# Patient Record
Sex: Male | Born: 1935
Health system: Southern US, Community
[De-identification: ages and names within clinical notes are randomized; demographics above are authoritative.]

## PROBLEM LIST (undated history)

## (undated) DIAGNOSIS — E7800 Pure hypercholesterolemia, unspecified: Secondary | ICD-10-CM

## (undated) DIAGNOSIS — I10 Essential (primary) hypertension: Secondary | ICD-10-CM

## (undated) DIAGNOSIS — M1990 Unspecified osteoarthritis, unspecified site: Secondary | ICD-10-CM

## (undated) DIAGNOSIS — C801 Malignant (primary) neoplasm, unspecified: Secondary | ICD-10-CM

## (undated) HISTORY — PX: VASECTOMY: SHX75

## (undated) HISTORY — PX: APPENDECTOMY: SHX54

## (undated) HISTORY — DX: Pure hypercholesterolemia, unspecified: E78.00

## (undated) HISTORY — PX: BACK SURGERY: SHX140

## (undated) HISTORY — DX: Essential (primary) hypertension: I10

---

## 2002-08-18 ENCOUNTER — Encounter: Payer: Self-pay | Admitting: Internal Medicine

## 2002-08-18 ENCOUNTER — Ambulatory Visit (HOSPITAL_COMMUNITY): Admission: RE | Admit: 2002-08-18 | Discharge: 2002-08-18 | Payer: Self-pay | Admitting: Internal Medicine

## 2004-11-15 ENCOUNTER — Ambulatory Visit (HOSPITAL_COMMUNITY): Admission: RE | Admit: 2004-11-15 | Discharge: 2004-11-15 | Payer: Self-pay | Admitting: Internal Medicine

## 2004-12-06 ENCOUNTER — Ambulatory Visit (HOSPITAL_COMMUNITY): Admission: RE | Admit: 2004-12-06 | Discharge: 2004-12-06 | Payer: Self-pay | Admitting: Internal Medicine

## 2006-11-17 ENCOUNTER — Ambulatory Visit: Admission: RE | Admit: 2006-11-17 | Discharge: 2007-02-15 | Payer: Self-pay | Admitting: Radiation Oncology

## 2006-12-14 ENCOUNTER — Encounter: Admission: RE | Admit: 2006-12-14 | Discharge: 2006-12-14 | Payer: Self-pay | Admitting: Internal Medicine

## 2007-01-06 ENCOUNTER — Ambulatory Visit (HOSPITAL_BASED_OUTPATIENT_CLINIC_OR_DEPARTMENT_OTHER): Admission: RE | Admit: 2007-01-06 | Discharge: 2007-01-06 | Payer: Self-pay | Admitting: Urology

## 2008-10-05 ENCOUNTER — Encounter (INDEPENDENT_AMBULATORY_CARE_PROVIDER_SITE_OTHER): Payer: Self-pay | Admitting: *Deleted

## 2008-11-20 ENCOUNTER — Ambulatory Visit: Payer: Self-pay | Admitting: Gastroenterology

## 2008-12-04 ENCOUNTER — Ambulatory Visit: Payer: Self-pay | Admitting: Gastroenterology

## 2010-09-03 NOTE — Op Note (Signed)
John Huffman, John Huffman                ACCOUNT NO.:  [id number]   MEDICAL RECORD NO.:  [id number]          PATIENT TYPE:  AMB   LOCATION:  [facility]                         FACILITY:  [facility]   PHYSICIAN:  Ronald L. Earlene Plater, M.D.  DATE OF BIRTH:  1928/08/18   DATE OF PROCEDURE:  01/06/2007  DATE OF DISCHARGE:                               OPERATIVE REPORT   DIAGNOSIS:  Adenocarcinoma of prostate.   OPERATIVE PROCEDURE:  Robotic armed Nucletron seed implantation with  iodine 125 seeds and flexible cystourethroscopy.   SURGEON:  Gaynelle Arabian, M.D.   ASSISTANT:  Dr. Chipper Herb   ANESTHESIA:  LMA.   BLOOD LOSS:  Negligible.   TUBES:  16-French Foley.   COMPLICATIONS:  None.   A total of 59 iodine 125 seeds were implanted with 22 needles at 27.6120  mCi per total apparent activity.   PROCEDURE:  John Huffman is a very nice 77 year old white male who  presented with an elevated PSA of 5.85.  He subsequently went biopsy of  prostate which revealed a Gleason 6 adenocarcinoma of the left base and  the left mid prostate.  His prostate was 41 cc.  He considered all  options in detail and after understanding risks, benefits and  alternatives and being properly informed and properly simulated, elected  proceed with implantation of radioactive seed implantation.   PROCEDURE IN DETAIL:  The patient was placed in supine position.  After  proper LMA anesthesia, he was placed in the dorsal lithotomy position  and prepped and draped with Betadine in sterile fashion.  16-French  Foley catheter was inserted and the bladder was drained and the B and K  biplanar transrectal ultrasound probe was placed and the prostate  imaged.  The prostate was then measured and dosimetric planning was then  performed and we were comfortable with the dosimetry of the prostate and  the urethra and rectum.  Following this, two holding needles were placed  in unused coordinates.  The base was set with the first planned  needle  and serial implantation was then performed utilizing the Nucletron  implanter device.  A total of 59 iodine 125 seeds were planted with 22  needles, total apparent activity was 27.6120 mCi.  Following  implantation, static images were obtained.  We were very comfortable  with location of seeds all hardware was removed.  The patient was placed  in supine position.  The Foley catheter was removed, scanned for seeds  and there were none within it.  Flexible cystourethroscopy was then  performed.  He was noted to have mild trilobar hypertrophy.  Bladder was  smooth-walled.  Efflux of clear urine was noted from normally placed  ureteral orifices bilaterally and no seeds or spacers in the urethra or  other lesions in the  urethra or the bladder.  Flexible cystourethroscope was visually  removed.  A new 16-French Foley catheter was inserted and placed to  drainage.  The patient tolerated procedure well and was taken to the  recovery room stable.  Puncture wound site was dressed sterilely.      Ronald L.  Earlene Plater, M.D.  Electronically Signed     RLD/MEDQ  D:  01/06/2007  T:  01/07/2007  Job:  [id number]

## 2011-01-31 LAB — CBC
Hemoglobin: 14.9
MCHC: 34.6
MCV: 88.9
RBC: 4.86

## 2011-01-31 LAB — APTT: aPTT: 30

## 2011-01-31 LAB — COMPREHENSIVE METABOLIC PANEL
BUN: 18
CO2: 28
Calcium: 9.2
Creatinine, Ser: 0.94
GFR calc non Af Amer: >60
Glucose, Bld: 98

## 2011-01-31 LAB — PROTIME-INR: Prothrombin Time: 13.3

## 2011-07-09 DIAGNOSIS — C61 Malignant neoplasm of prostate: Secondary | ICD-10-CM | POA: Diagnosis not present

## 2011-08-19 DIAGNOSIS — Z85828 Personal history of other malignant neoplasm of skin: Secondary | ICD-10-CM | POA: Diagnosis not present

## 2011-08-19 DIAGNOSIS — L570 Actinic keratosis: Secondary | ICD-10-CM | POA: Diagnosis not present

## 2011-08-19 DIAGNOSIS — C44519 Basal cell carcinoma of skin of other part of trunk: Secondary | ICD-10-CM | POA: Diagnosis not present

## 2011-08-19 DIAGNOSIS — D0470 Carcinoma in situ of skin of unspecified lower limb, including hip: Secondary | ICD-10-CM | POA: Diagnosis not present

## 2011-08-19 DIAGNOSIS — C44721 Squamous cell carcinoma of skin of unspecified lower limb, including hip: Secondary | ICD-10-CM | POA: Diagnosis not present

## 2011-08-19 DIAGNOSIS — L821 Other seborrheic keratosis: Secondary | ICD-10-CM | POA: Diagnosis not present

## 2011-09-29 DIAGNOSIS — C61 Malignant neoplasm of prostate: Secondary | ICD-10-CM | POA: Diagnosis not present

## 2011-09-29 DIAGNOSIS — N320 Bladder-neck obstruction: Secondary | ICD-10-CM | POA: Diagnosis not present

## 2011-09-29 DIAGNOSIS — R972 Elevated prostate specific antigen [PSA]: Secondary | ICD-10-CM | POA: Diagnosis not present

## 2011-10-06 DIAGNOSIS — I10 Essential (primary) hypertension: Secondary | ICD-10-CM | POA: Diagnosis not present

## 2011-10-06 DIAGNOSIS — E785 Hyperlipidemia, unspecified: Secondary | ICD-10-CM | POA: Diagnosis not present

## 2011-10-06 DIAGNOSIS — Z79899 Other long term (current) drug therapy: Secondary | ICD-10-CM | POA: Diagnosis not present

## 2011-11-04 DIAGNOSIS — E785 Hyperlipidemia, unspecified: Secondary | ICD-10-CM | POA: Diagnosis not present

## 2011-11-04 DIAGNOSIS — I10 Essential (primary) hypertension: Secondary | ICD-10-CM | POA: Diagnosis not present

## 2011-11-04 DIAGNOSIS — C61 Malignant neoplasm of prostate: Secondary | ICD-10-CM | POA: Diagnosis not present

## 2011-11-12 ENCOUNTER — Ambulatory Visit (HOSPITAL_COMMUNITY)
Admission: RE | Admit: 2011-11-12 | Discharge: 2011-11-12 | Disposition: A | Discharge status: Home / Self Care | Payer: Medicare Other | Source: Ambulatory Visit | Attending: Internal Medicine | Admitting: Internal Medicine

## 2011-11-12 DIAGNOSIS — I359 Nonrheumatic aortic valve disorder, unspecified: Secondary | ICD-10-CM | POA: Insufficient documentation

## 2011-11-12 DIAGNOSIS — R011 Cardiac murmur, unspecified: Secondary | ICD-10-CM | POA: Diagnosis not present

## 2011-11-12 NOTE — Progress Notes (Signed)
*  PRELIMINARY RESULTS* Echocardiogram 2D Echocardiogram has been performed.  John Huffman 11/12/2011, 2:51 PM

## 2011-11-13 DIAGNOSIS — H6120 Impacted cerumen, unspecified ear: Secondary | ICD-10-CM | POA: Diagnosis not present

## 2011-12-04 ENCOUNTER — Ambulatory Visit (INDEPENDENT_AMBULATORY_CARE_PROVIDER_SITE_OTHER): Payer: Medicare Other | Admitting: Otolaryngology

## 2011-12-04 DIAGNOSIS — R1312 Dysphagia, oropharyngeal phase: Secondary | ICD-10-CM

## 2011-12-05 ENCOUNTER — Other Ambulatory Visit (INDEPENDENT_AMBULATORY_CARE_PROVIDER_SITE_OTHER): Payer: Self-pay | Admitting: Otolaryngology

## 2011-12-05 DIAGNOSIS — K117 Disturbances of salivary secretion: Secondary | ICD-10-CM

## 2011-12-10 ENCOUNTER — Ambulatory Visit (HOSPITAL_COMMUNITY)
Admission: RE | Admit: 2011-12-10 | Discharge: 2011-12-10 | Disposition: A | Discharge status: Home / Self Care | Payer: Medicare Other | Source: Ambulatory Visit | Attending: Otolaryngology | Admitting: Otolaryngology

## 2011-12-10 DIAGNOSIS — K1120 Sialoadenitis, unspecified: Secondary | ICD-10-CM | POA: Diagnosis not present

## 2011-12-10 DIAGNOSIS — K117 Disturbances of salivary secretion: Secondary | ICD-10-CM | POA: Diagnosis not present

## 2011-12-10 DIAGNOSIS — K449 Diaphragmatic hernia without obstruction or gangrene: Secondary | ICD-10-CM | POA: Diagnosis not present

## 2011-12-19 ENCOUNTER — Other Ambulatory Visit: Payer: Self-pay | Admitting: Otolaryngology

## 2011-12-19 ENCOUNTER — Other Ambulatory Visit: Payer: Self-pay | Admitting: Internal Medicine

## 2011-12-19 DIAGNOSIS — K228 Other specified diseases of esophagus: Secondary | ICD-10-CM

## 2011-12-29 DIAGNOSIS — R972 Elevated prostate specific antigen [PSA]: Secondary | ICD-10-CM | POA: Diagnosis not present

## 2012-01-08 ENCOUNTER — Encounter (INDEPENDENT_AMBULATORY_CARE_PROVIDER_SITE_OTHER): Payer: Self-pay | Admitting: *Deleted

## 2012-01-08 ENCOUNTER — Encounter (INDEPENDENT_AMBULATORY_CARE_PROVIDER_SITE_OTHER): Payer: Self-pay | Admitting: Internal Medicine

## 2012-01-08 ENCOUNTER — Ambulatory Visit (INDEPENDENT_AMBULATORY_CARE_PROVIDER_SITE_OTHER): Payer: Medicare Other | Admitting: Internal Medicine

## 2012-01-08 ENCOUNTER — Other Ambulatory Visit (INDEPENDENT_AMBULATORY_CARE_PROVIDER_SITE_OTHER): Payer: Self-pay | Admitting: *Deleted

## 2012-01-08 VITALS — BP 100/72 | HR 72 | Temp 98.2°F | Ht 71.0 in | Wt 187.7 lb

## 2012-01-08 DIAGNOSIS — K229 Disease of esophagus, unspecified: Secondary | ICD-10-CM

## 2012-01-08 DIAGNOSIS — K219 Gastro-esophageal reflux disease without esophagitis: Secondary | ICD-10-CM | POA: Diagnosis not present

## 2012-01-08 DIAGNOSIS — I10 Essential (primary) hypertension: Secondary | ICD-10-CM | POA: Insufficient documentation

## 2012-01-08 DIAGNOSIS — R9389 Abnormal findings on diagnostic imaging of other specified body structures: Secondary | ICD-10-CM | POA: Insufficient documentation

## 2012-01-08 DIAGNOSIS — E7800 Pure hypercholesterolemia, unspecified: Secondary | ICD-10-CM | POA: Insufficient documentation

## 2012-01-08 MED ORDER — OMEPRAZOLE 40 MG PO CPDR: 40.0000 mg | DELAYED_RELEASE_CAPSULE | Freq: Every day | ORAL | Status: DC

## 2012-01-08 NOTE — Progress Notes (Signed)
Subjective:     Patient ID: John Huffman, male   DOB: 03/19/1929, 78 y.o.   MRN: [id number]  HPIReferred to our office by Dr. Suszanne Conners for an abnormal DG Esophagus.  He tells me he is not having trouble swallowing. He says his saliva glands are overactive. He says he can "spit" every 30 seconds. He also tells me his eyes water all the time excessively. Symptoms for over a year. He says he can eat anything he wants. No problem eating steak.  Appetite is good. No weight loss. BM x 1 a day. Fiberoptic laryngoscopy in August of this by Dr Suszanne Conners: significant laryngeal edema and erythema. No suspicious mass or lesion is noted. Moderate amount of saliva is noted to be pooling within his mouth and pharynx.   12/05/2011 DG Esophagus: Occasional tertiary contractions.  Small hiatal hernia.  Mild focal distal esophageal narrowing. This does not cause  difficulty with passage of the barium tablet.   Review of Systems see hpi Current Outpatient Prescriptions  Medication Sig Dispense Refill  . amLODipine (NORVASC) 5 MG tablet Take 5 mg by mouth daily. Also taking Micardus 80mg       . aspirin 81 MG tablet Take 81 mg by mouth daily.      Marland Kitchen ibuprofen (ADVIL,MOTRIN) 100 MG chewable tablet Chew 100 mg by mouth every 8 (eight) hours as needed.      . simvastatin (ZOCOR) 20 MG tablet Take 20 mg by mouth every evening.      . Tamsulosin HCl (FLOMAX) 0.4 MG CAPS Take by mouth.      Marland Kitchen omeprazole (PRILOSEC) 40 MG capsule Take 1 capsule (40 mg total) by mouth daily.  90 capsule  3   Past Medical History  Diagnosis Date  . Hypertension   . High cholesterol    Past Surgical History  Procedure Date  . Back surgery   . Appendectomy   . Vasectomy    Family Status  Relation Status Death Age  . Mother Deceased     Pancreatic cancer  . Father Deceased     DM, complications of  (age 7)   History   Social History  . Marital Status: Married    Spouse Name: N/A    Number of Children: N/A  . Years of Education:  N/A   Occupational History  . Not on file.   Social History Main Topics  . Smoking status: Never Smoker   . Smokeless tobacco: Not on file  . Alcohol Use: No  . Drug Use: No  . Sexually Active: Not on file   Other Topics Concern  . Not on file   Social History Narrative  . No narrative on file   No Known Allergies       Objective:   Physical Exam Filed Vitals:   01/08/12 1508  BP: 100/72  Pulse: 72  Temp: 98.2 F (36.8 C)  Height: 5\' 11"  (1.803 m)  Weight: 187 lb 11.2 oz (85.14 kg)  Alert and oriented. Skin warm and dry. Oral mucosa is moist.   . Sclera anicteric, conjunctivae is pink. Thyroid not enlarged. No cervical lymphadenopathy. Lungs clear. Heart regular rate and rhythm.  Abdomen is soft. Bowel sounds are positive. No hepatomegaly. No abdominal masses felt. No tenderness.  No edema to lower extremities.  .       Assessment:    Excessive salivation. Abnormal Swallow test which showing distal esophageal  Narrowing. Esophageal stricture needs to be ruled out.    Plan:  EGD/ED. The risks and benefits such as perforation, bleeding, and infection were reviewed with the patient and is agreeable.    Omeprazole 40mg  po e-prescribed to [facility]

## 2012-01-08 NOTE — Patient Instructions (Addendum)
EGD/ED. The risks and benefits such as perforation, bleeding, and infection were reviewed with the patient and is agreeable.  Rx for Omeprazole e-prescribed to [facility]

## 2012-01-14 ENCOUNTER — Encounter (HOSPITAL_COMMUNITY): Payer: Self-pay | Admitting: Pharmacy Technician

## 2012-01-20 MED ORDER — SODIUM CHLORIDE 0.45 % IV SOLN
INTRAVENOUS | Status: DC
  Administered 2012-01-21: 10:00:00 via INTRAVENOUS

## 2012-01-21 ENCOUNTER — Ambulatory Visit (HOSPITAL_COMMUNITY)
Admission: RE | Admit: 2012-01-21 | Discharge: 2012-01-21 | Disposition: A | Discharge status: Home / Self Care | Payer: Medicare Other | Source: Ambulatory Visit | Attending: Internal Medicine | Admitting: Internal Medicine

## 2012-01-21 ENCOUNTER — Encounter (HOSPITAL_COMMUNITY)
Admission: RE | Disposition: A | Discharge status: Home / Self Care | Payer: Self-pay | Source: Ambulatory Visit | Attending: Internal Medicine

## 2012-01-21 ENCOUNTER — Encounter (HOSPITAL_COMMUNITY): Payer: Self-pay | Admitting: *Deleted

## 2012-01-21 DIAGNOSIS — K228 Other specified diseases of esophagus: Secondary | ICD-10-CM | POA: Insufficient documentation

## 2012-01-21 DIAGNOSIS — I10 Essential (primary) hypertension: Secondary | ICD-10-CM | POA: Diagnosis not present

## 2012-01-21 DIAGNOSIS — K229 Disease of esophagus, unspecified: Secondary | ICD-10-CM

## 2012-01-21 DIAGNOSIS — D1779 Benign lipomatous neoplasm of other sites: Secondary | ICD-10-CM | POA: Insufficient documentation

## 2012-01-21 DIAGNOSIS — K117 Disturbances of salivary secretion: Secondary | ICD-10-CM | POA: Diagnosis not present

## 2012-01-21 DIAGNOSIS — D1330 Benign neoplasm of unspecified part of small intestine: Secondary | ICD-10-CM | POA: Diagnosis not present

## 2012-01-21 DIAGNOSIS — K2289 Other specified disease of esophagus: Secondary | ICD-10-CM | POA: Insufficient documentation

## 2012-01-21 DIAGNOSIS — E7800 Pure hypercholesterolemia, unspecified: Secondary | ICD-10-CM | POA: Diagnosis not present

## 2012-01-21 DIAGNOSIS — K3182 Dieulafoy lesion (hemorrhagic) of stomach and duodenum: Secondary | ICD-10-CM

## 2012-01-21 HISTORY — DX: Malignant (primary) neoplasm, unspecified: C80.1

## 2012-01-21 SURGERY — ESOPHAGOGASTRODUODENOSCOPY (EGD) WITH ESOPHAGEAL DILATION
Anesthesia: Moderate Sedation

## 2012-01-21 MED ORDER — STERILE WATER FOR IRRIGATION IR SOLN
Status: DC | PRN
  Administered 2012-01-21: 10:00:00

## 2012-01-21 MED ORDER — MEPERIDINE HCL 50 MG/ML IJ SOLN
INTRAMUSCULAR | Status: AC
  Filled 2012-01-21: qty 1

## 2012-01-21 MED ORDER — MIDAZOLAM HCL 5 MG/5ML IJ SOLN
INTRAMUSCULAR | Status: DC | PRN
  Administered 2012-01-21: 1 mg via INTRAVENOUS
  Administered 2012-01-21 (×2): 2 mg via INTRAVENOUS

## 2012-01-21 MED ORDER — MEPERIDINE HCL 25 MG/ML IJ SOLN
INTRAMUSCULAR | Status: DC | PRN
  Administered 2012-01-21 (×2): 25 mg via INTRAVENOUS

## 2012-01-21 MED ORDER — MIDAZOLAM HCL 5 MG/5ML IJ SOLN
INTRAMUSCULAR | Status: AC
  Filled 2012-01-21: qty 10

## 2012-01-21 MED ORDER — PANTOPRAZOLE SODIUM 40 MG PO TBEC: 40.0000 mg | DELAYED_RELEASE_TABLET | Freq: Every day | ORAL | Status: DC

## 2012-01-21 NOTE — Op Note (Signed)
EGD PROCEDURE REPORT  PATIENT:  John Huffman  MR#:  [id number] Birthdate:  [birth year], 78 y.o., male Endoscopist:  Dr. Malissa Hippo, MD Referred By:  Dr. Darletta Moll, MD Procedure Date: 01/21/2012  Procedure:   EGD  Indications:  Patient is 78 year old Caucasian male who has been experiencing excessive salivation for one year. He has not responded PPI therapy. He was evaluated by Dr. to and no abnormality found to account for symptoms. He had barium study revealing small sliding heart hernia, tertiary contractions and noncritical distal esophagus. Denies dysphagia. He is undergoing diagnostic EGD.            Informed Consent:  The risks, benefits, alternatives & imponderables which include, but are not limited to, bleeding, infection, perforation, drug reaction and potential missed lesion have been reviewed.  The potential for biopsy, lesion removal, esophageal dilation, etc. have also been discussed.  Questions have been answered.  All parties agreeable.  Please see history & physical in medical record for more information.  Medications:  Demerol 50 mg IV Versed 5 mg IV Cetacaine spray topically for oropharyngeal anesthesia  Description of procedure:  The endoscope was introduced through the mouth and advanced to the second portion of the duodenum without difficulty or limitations. The mucosal surfaces were surveyed very carefully during advancement of the scope and upon withdrawal.  Findings:  Esophagus:  8-10 mm very soft submucosal lesion noted just below  UES with yellowish discoloration. It was soft on palpation with biopsy forceps. GEJ:  40 cm Stomach:  Stomach was empty and distended very well with insufflation. Folds in the proximal stomach were normal. Examination of mucosa at body, antrum, pyloric channel, angularis, fundus and cardia was normal. Duodenum:  Normal bulbar mucosa. Very prominent bifid ampulla of Vater possibly due to submucosal lipoma. It was very soft on  palpation. Biopsy was taken.  Therapeutic/Diagnostic Maneuvers Performed:  See above  Complications:  None  Impression: No evidence of erosive esophagitis or peptic ulcer disease. 8-10 mm submucosal lipoma just below the UES possibly of no significance. Prominent large ampulla of Vater possibly due to submucosal lipoma. Biopsy taken.  Recommendations:  Continue anti-reflux measures. Discontinue omeprazole. Pantoprazole 40 mg by mouth twice a day. I will contact patient with biopsy results and further recommendations.  REHMAN,NAJEEB U  01/21/2012  10:39 AM  CC: Dr. Carylon Perches, MD & Dr. Bonnetta Barry ref. provider found         Dr. Seward Meth, Jerl Santos, MD

## 2012-01-21 NOTE — H&P (Signed)
John Huffman is an 78 y.o. male.   Chief Complaint: Patient is here for esophagogastroduodenoscopy. HPI: Patient is 78 year old Caucasian male who presents with one-year history of excessive salivation and tear formation. He underwent evaluation by Dr. Suszanne Conners and no abnormality was found to account for symptoms. She also a.m. study was suggested she contracts is an noncritical narrowing GE junction. Ileum culture versus area without any delay. Patient denies heartburn or dysphagia to solids. Also complains of back taste in his mouth. He does not have any difficulty at night.  Past Medical History  Diagnosis Date  . Hypertension   . High cholesterol   . Cancer     Proatate    Past Surgical History  Procedure Date  . Back surgery   . Appendectomy   . Vasectomy     History reviewed. No pertinent family history. Social History:  reports that he has never smoked. He does not have any smokeless tobacco history on file. He reports that he does not drink alcohol or use illicit drugs.  Allergies: No Known Allergies  Medications Prior to Admission  Medication Sig Dispense Refill  . amLODipine (NORVASC) 5 MG tablet Take 5 mg by mouth daily. Also taking Micardus 80mg       . losartan (COZAAR) 100 MG tablet Take 100 mg by mouth daily.      Marland Kitchen omeprazole (PRILOSEC) 40 MG capsule Take 1 capsule (40 mg total) by mouth daily.  90 capsule  3  . simvastatin (ZOCOR) 20 MG tablet Take 20 mg by mouth every evening.      . Tamsulosin HCl (FLOMAX) 0.4 MG CAPS Take by mouth.      Marland Kitchen aspirin 81 MG tablet Take 81 mg by mouth daily.        No results found for this or any previous visit (from the past 48 hour(s)). No results found.  ROS  Blood pressure 139/78, pulse 80, temperature 97.8 F (36.6 C), temperature source Oral, resp. rate 18, height 5\' 11"  (1.803 m), weight 187 lb (84.823 kg), SpO2 94.00%. Physical Exam  Constitutional: He appears well-developed and well-nourished.  HENT:  Mouth/Throat:  Oropharynx is clear and moist.  Eyes: Conjunctivae normal are normal. No scleral icterus.  Neck: No thyromegaly present.  Cardiovascular: Normal rate and regular rhythm.   Murmur (grade 2/6 systolic ejection murmur at aortic area) heard. Respiratory: Effort normal and breath sounds normal.  GI: Soft. He exhibits no distension and no mass. There is no tenderness.  Musculoskeletal: He exhibits no edema.  Lymphadenopathy:    He has no cervical adenopathy.  Neurological: He is alert.  Skin: Skin is warm and dry.     Assessment/Plan Excessive salivation. ? GERD. Diagnostic esophagogastroduodenoscopy.  JohnNAJEEB Huffman 01/21/2012, 10:10 AM

## 2012-01-30 ENCOUNTER — Encounter (INDEPENDENT_AMBULATORY_CARE_PROVIDER_SITE_OTHER): Payer: Self-pay | Admitting: *Deleted

## 2012-02-17 ENCOUNTER — Other Ambulatory Visit: Payer: Self-pay | Admitting: Dermatology

## 2012-02-17 DIAGNOSIS — L821 Other seborrheic keratosis: Secondary | ICD-10-CM | POA: Diagnosis not present

## 2012-02-17 DIAGNOSIS — L570 Actinic keratosis: Secondary | ICD-10-CM | POA: Diagnosis not present

## 2012-02-17 DIAGNOSIS — C4401 Basal cell carcinoma of skin of lip: Secondary | ICD-10-CM | POA: Diagnosis not present

## 2012-02-17 DIAGNOSIS — D485 Neoplasm of uncertain behavior of skin: Secondary | ICD-10-CM | POA: Diagnosis not present

## 2012-02-17 DIAGNOSIS — C44319 Basal cell carcinoma of skin of other parts of face: Secondary | ICD-10-CM | POA: Diagnosis not present

## 2012-02-17 DIAGNOSIS — D2330 Other benign neoplasm of skin of unspecified part of face: Secondary | ICD-10-CM | POA: Diagnosis not present

## 2012-02-17 DIAGNOSIS — Z85828 Personal history of other malignant neoplasm of skin: Secondary | ICD-10-CM | POA: Diagnosis not present

## 2012-02-23 DIAGNOSIS — Z23 Encounter for immunization: Secondary | ICD-10-CM | POA: Diagnosis not present

## 2012-03-29 DIAGNOSIS — R972 Elevated prostate specific antigen [PSA]: Secondary | ICD-10-CM | POA: Diagnosis not present

## 2012-03-29 DIAGNOSIS — N320 Bladder-neck obstruction: Secondary | ICD-10-CM | POA: Diagnosis not present

## 2012-03-29 DIAGNOSIS — C61 Malignant neoplasm of prostate: Secondary | ICD-10-CM | POA: Diagnosis not present

## 2012-05-04 DIAGNOSIS — I10 Essential (primary) hypertension: Secondary | ICD-10-CM | POA: Diagnosis not present

## 2012-08-17 ENCOUNTER — Telehealth (INDEPENDENT_AMBULATORY_CARE_PROVIDER_SITE_OTHER): Payer: Self-pay | Admitting: Internal Medicine

## 2012-08-17 ENCOUNTER — Other Ambulatory Visit (INDEPENDENT_AMBULATORY_CARE_PROVIDER_SITE_OTHER): Payer: Self-pay | Admitting: Internal Medicine

## 2012-08-17 MED ORDER — LORAZEPAM 0.5 MG PO TABS: 0.5000 mg | ORAL_TABLET | Freq: Two times a day (BID) | ORAL | Status: DC

## 2012-08-17 NOTE — Telephone Encounter (Signed)
Patient's condition discussed with Dr. Suszanne Conners who saw patient last year. He feels patient's symptoms may do to swallowing issues rather than excessive salivation. Will try him on lorazepam 0.25 2.5 mg by mouth twice a day. Of into her recommendations with the patient over the phone and pharmacy called the prescription. He was informed of potential side effects. He will call us with progress report in one to 2 weeks.

## 2012-08-19 ENCOUNTER — Other Ambulatory Visit: Payer: Self-pay | Admitting: Dermatology

## 2012-08-19 DIAGNOSIS — C44319 Basal cell carcinoma of skin of other parts of face: Secondary | ICD-10-CM | POA: Diagnosis not present

## 2012-08-19 DIAGNOSIS — Z85828 Personal history of other malignant neoplasm of skin: Secondary | ICD-10-CM | POA: Diagnosis not present

## 2012-08-19 DIAGNOSIS — L821 Other seborrheic keratosis: Secondary | ICD-10-CM | POA: Diagnosis not present

## 2012-08-19 DIAGNOSIS — L570 Actinic keratosis: Secondary | ICD-10-CM | POA: Diagnosis not present

## 2012-08-19 DIAGNOSIS — D485 Neoplasm of uncertain behavior of skin: Secondary | ICD-10-CM | POA: Diagnosis not present

## 2012-08-25 ENCOUNTER — Encounter (INDEPENDENT_AMBULATORY_CARE_PROVIDER_SITE_OTHER): Payer: Self-pay

## 2012-09-27 ENCOUNTER — Ambulatory Visit (INDEPENDENT_AMBULATORY_CARE_PROVIDER_SITE_OTHER): Payer: Medicare Other | Admitting: Internal Medicine

## 2012-09-27 ENCOUNTER — Encounter (INDEPENDENT_AMBULATORY_CARE_PROVIDER_SITE_OTHER): Payer: Self-pay | Admitting: Internal Medicine

## 2012-09-27 VITALS — BP 110/68 | HR 72 | Temp 98.0°F | Resp 16 | Ht 71.0 in | Wt 183.2 lb

## 2012-09-27 DIAGNOSIS — R634 Abnormal weight loss: Secondary | ICD-10-CM | POA: Diagnosis not present

## 2012-09-27 DIAGNOSIS — K117 Disturbances of salivary secretion: Secondary | ICD-10-CM | POA: Diagnosis not present

## 2012-09-27 DIAGNOSIS — R109 Unspecified abdominal pain: Secondary | ICD-10-CM | POA: Insufficient documentation

## 2012-09-27 DIAGNOSIS — R51 Headache: Secondary | ICD-10-CM

## 2012-09-27 NOTE — Progress Notes (Signed)
Presenting complaint;  Excessive salivation. Patient also complains of abdominal pain and right-sided headache.  Subjective:  Patient is 79 year old Caucasian male who is here for scheduled visit. He's been dealing for excessive salivation for several months. ENT evaluation was negative. He underwent EGD in October 2013. He had small esophageal submucosal lesion possibly lipoma and a prominent ampulla of Vater biopsy which revealed benign tissue. Patient was treated with a PPI but without symptomatic improvement. About 5 weeks ago he was begun on breast exam which she's been taking twice daily. He cannot tell any difference. He is not having any side effects from this medication. He feels he makes too much saliva and has to clear his throat often. He saw learned to live with it. Today he is also complaining of pain in right temporal region for the last 3-4 months which he describes as pressure or mild pain. He has not experienced any auditory or visual symptoms. He has to stain very often and not daily. He also complains of intermittent right mid abdominal pain, loss of appetite. He states he has lost 8 pounds since December 2013. He denies nausea vomiting diarrhea constipation melena or rectal bleeding.  Current Medications: Current Outpatient Prescriptions  Medication Sig Dispense Refill  . amLODipine (NORVASC) 5 MG tablet Take 5 mg by mouth daily.      Marland Kitchen aspirin 81 MG tablet Take 81 mg by mouth daily.      Marland Kitchen LORazepam (ATIVAN) 0.5 MG tablet Take 1 tablet (0.5 mg total) by mouth 2 (two) times daily.  60 tablet  1  . losartan (COZAAR) 100 MG tablet Take 100 mg by mouth daily.      . pantoprazole (PROTONIX) 40 MG tablet Take 1 tablet (40 mg total) by mouth daily.  60 tablet  3  . simvastatin (ZOCOR) 20 MG tablet Take 20 mg by mouth every evening.      . Tamsulosin HCl (FLOMAX) 0.4 MG CAPS Take by mouth.       No current facility-administered medications for this visit.      Objective: Blood pressure 110/68, pulse 72, temperature 98 F (36.7 C), temperature source Oral, resp. rate 16, height 5\' 11"  (1.803 m), weight 183 lb 3.2 oz (83.099 kg). Patient is alert and in no acute distress. Both temporal arteries are palpable; no tenderness noted. Conjunctiva is pink. Sclera is nonicteric Oropharyngeal mucosa is normal. No neck masses or thyromegaly noted. Cardiac exam with regular rhythm normal S1 and S2. No murmur or gallop noted. Lungs are clear to auscultation. Abdomen is symmetrical. Bowel sounds are normal. No bruits noted. There is fullness involving the right. Molecular region with mild tenderness but no discrete mass palpable. No organomegaly or masses.  No LE edema or clubbing noted.   Assessment:  #1. Hypersalivation of unknown etiology. He is not a candidate for anti-cholinergic therapy. He did not respond to low-dose lorazepam or PPI. Will monitor him for now. #2. Right-sided abdominal pain associated with anorexia and 8 pound weight loss and fullness noted on exam. This symptom complex needs to be further evaluated. #3. Right temporal headache. No other symptoms suggest temporal arteritis.    Plan:  Decrease lorazepam dose to 0.5 mg daily for 10 days and then stop. Will check sedimentation rate. Abdominopelvic CT with contrast. He was also need serum creatinine. Further recommendations to follow.

## 2012-09-27 NOTE — Patient Instructions (Signed)
Decrease lorazepam dose to 0.5 mg once a day for 10 days and stop. Physician will contact you with results of blood work and CT abdomen and pelvis

## 2012-09-30 ENCOUNTER — Ambulatory Visit (HOSPITAL_COMMUNITY)
Admission: RE | Admit: 2012-09-30 | Discharge: 2012-09-30 | Disposition: A | Discharge status: Home / Self Care | Payer: Medicare Other | Source: Ambulatory Visit | Attending: Internal Medicine | Admitting: Internal Medicine

## 2012-09-30 DIAGNOSIS — Z8546 Personal history of malignant neoplasm of prostate: Secondary | ICD-10-CM | POA: Diagnosis not present

## 2012-09-30 DIAGNOSIS — R937 Abnormal findings on diagnostic imaging of other parts of musculoskeletal system: Secondary | ICD-10-CM | POA: Insufficient documentation

## 2012-09-30 DIAGNOSIS — I700 Atherosclerosis of aorta: Secondary | ICD-10-CM | POA: Diagnosis not present

## 2012-09-30 DIAGNOSIS — R634 Abnormal weight loss: Secondary | ICD-10-CM | POA: Diagnosis not present

## 2012-09-30 DIAGNOSIS — R109 Unspecified abdominal pain: Secondary | ICD-10-CM | POA: Diagnosis not present

## 2012-09-30 LAB — CREATININE, SERUM: Creat: 0.91 mg/dL (ref 0.50–1.35)

## 2012-09-30 LAB — SEDIMENTATION RATE: Sed Rate: 6 mm/hr (ref 0–16)

## 2012-09-30 MED ORDER — IOHEXOL 300 MG/ML  SOLN
100.0000 mL | Freq: Once | INTRAMUSCULAR | Status: AC | PRN
  Administered 2012-09-30: 100 mL via INTRAVENOUS

## 2012-10-01 ENCOUNTER — Ambulatory Visit (HOSPITAL_COMMUNITY): Payer: Medicare Other

## 2012-10-01 ENCOUNTER — Other Ambulatory Visit (HOSPITAL_COMMUNITY): Payer: Medicare Other

## 2012-10-04 DIAGNOSIS — C61 Malignant neoplasm of prostate: Secondary | ICD-10-CM | POA: Diagnosis not present

## 2012-10-04 DIAGNOSIS — N320 Bladder-neck obstruction: Secondary | ICD-10-CM | POA: Diagnosis not present

## 2012-10-04 DIAGNOSIS — R972 Elevated prostate specific antigen [PSA]: Secondary | ICD-10-CM | POA: Diagnosis not present

## 2012-10-15 ENCOUNTER — Other Ambulatory Visit (HOSPITAL_COMMUNITY): Payer: Self-pay | Admitting: Internal Medicine

## 2012-10-15 DIAGNOSIS — Z8546 Personal history of malignant neoplasm of prostate: Secondary | ICD-10-CM

## 2012-10-18 ENCOUNTER — Encounter (HOSPITAL_COMMUNITY)
Admission: RE | Admit: 2012-10-18 | Discharge: 2012-10-18 | Disposition: A | Discharge status: Home / Self Care | Payer: Medicare Other | Source: Ambulatory Visit | Attending: Internal Medicine | Admitting: Internal Medicine

## 2012-10-18 ENCOUNTER — Encounter (HOSPITAL_COMMUNITY): Payer: Self-pay

## 2012-10-18 DIAGNOSIS — Z8546 Personal history of malignant neoplasm of prostate: Secondary | ICD-10-CM | POA: Insufficient documentation

## 2012-10-18 DIAGNOSIS — C61 Malignant neoplasm of prostate: Secondary | ICD-10-CM | POA: Diagnosis not present

## 2012-10-18 MED ORDER — TECHNETIUM TC 99M MEDRONATE IV KIT
25.0000 | PACK | Freq: Once | INTRAVENOUS | Status: AC | PRN
  Administered 2012-10-18: 25 via INTRAVENOUS

## 2012-10-19 ENCOUNTER — Other Ambulatory Visit (HOSPITAL_COMMUNITY): Payer: Self-pay | Admitting: Internal Medicine

## 2012-10-19 DIAGNOSIS — C61 Malignant neoplasm of prostate: Secondary | ICD-10-CM

## 2012-10-19 DIAGNOSIS — R937 Abnormal findings on diagnostic imaging of other parts of musculoskeletal system: Secondary | ICD-10-CM

## 2012-10-19 DIAGNOSIS — C799 Secondary malignant neoplasm of unspecified site: Secondary | ICD-10-CM

## 2012-10-20 ENCOUNTER — Ambulatory Visit (HOSPITAL_COMMUNITY)
Admission: RE | Admit: 2012-10-20 | Discharge: 2012-10-20 | Disposition: A | Discharge status: Home / Self Care | Payer: Medicare Other | Source: Ambulatory Visit | Attending: Internal Medicine | Admitting: Internal Medicine

## 2012-10-20 DIAGNOSIS — C799 Secondary malignant neoplasm of unspecified site: Secondary | ICD-10-CM

## 2012-10-20 DIAGNOSIS — M6281 Muscle weakness (generalized): Secondary | ICD-10-CM | POA: Insufficient documentation

## 2012-10-20 DIAGNOSIS — G319 Degenerative disease of nervous system, unspecified: Secondary | ICD-10-CM | POA: Insufficient documentation

## 2012-10-20 DIAGNOSIS — R29818 Other symptoms and signs involving the nervous system: Secondary | ICD-10-CM | POA: Diagnosis not present

## 2012-10-20 DIAGNOSIS — Z8546 Personal history of malignant neoplasm of prostate: Secondary | ICD-10-CM | POA: Insufficient documentation

## 2012-11-09 DIAGNOSIS — Z79899 Other long term (current) drug therapy: Secondary | ICD-10-CM | POA: Diagnosis not present

## 2012-11-09 DIAGNOSIS — R7301 Impaired fasting glucose: Secondary | ICD-10-CM | POA: Diagnosis not present

## 2012-11-09 DIAGNOSIS — I10 Essential (primary) hypertension: Secondary | ICD-10-CM | POA: Diagnosis not present

## 2012-11-09 DIAGNOSIS — E785 Hyperlipidemia, unspecified: Secondary | ICD-10-CM | POA: Diagnosis not present

## 2012-11-09 DIAGNOSIS — C61 Malignant neoplasm of prostate: Secondary | ICD-10-CM | POA: Diagnosis not present

## 2012-11-16 DIAGNOSIS — E785 Hyperlipidemia, unspecified: Secondary | ICD-10-CM | POA: Diagnosis not present

## 2012-11-16 DIAGNOSIS — C61 Malignant neoplasm of prostate: Secondary | ICD-10-CM | POA: Diagnosis not present

## 2012-11-16 DIAGNOSIS — I10 Essential (primary) hypertension: Secondary | ICD-10-CM | POA: Diagnosis not present

## 2012-12-28 ENCOUNTER — Encounter (INDEPENDENT_AMBULATORY_CARE_PROVIDER_SITE_OTHER): Payer: Self-pay | Admitting: Internal Medicine

## 2012-12-28 ENCOUNTER — Ambulatory Visit (INDEPENDENT_AMBULATORY_CARE_PROVIDER_SITE_OTHER): Payer: Medicare Other | Admitting: Internal Medicine

## 2012-12-28 VITALS — BP 116/74 | HR 70 | Temp 97.7°F | Resp 16 | Ht 71.0 in | Wt 182.9 lb

## 2012-12-28 DIAGNOSIS — K117 Disturbances of salivary secretion: Secondary | ICD-10-CM

## 2012-12-28 NOTE — Patient Instructions (Signed)
Take pantoprazole 40 mg by mouth 30 minutes before breakfast daily for one month and then every other day for one month and stop. If you start experiencing heartburn regurgitation you can back on pantoprazole and let us know.

## 2012-12-28 NOTE — Progress Notes (Signed)
Presenting complaint;  Excessive salivation.  Database/Subjective:  Patient is a 79-year-old Caucasian male who has several month history of excessive salivation who presents for scheduled visit. He was last seen 3 months ago. On his last visit he was also complaining of right-sided abdominal pain and underwent abdominopelvic CT which revealed no visceral abnormality. 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 He subsequent had bone scan by Dr. Ouida Sills which did not show any abnormality to this region but it showed abnormality to the left posterior skull Subsequent unenhanced head CT was negative for lytic or sclerotic lesions involving the skull. He feels better. He states his excessive salivation has not changed but he has learned to call with that. He seemed to do well when he has been called for doing other chores. His appetite has not like it used to be but he has not lost any more weight. Prior to his last visit of June 2014 he had lost 8 pounds. He has not experienced abdominal pain anymore. He denies heartburn hoarseness sore throat chronic cough nausea or vomiting. He also denies melena or rectal bleeding. He takes stool softener when necessary for constipation which is not listed below. He would like to come off pantoprazole as he does not see any benefit by staying on this medication. He was begun on double dose PPI by Dr. Suszanne Conners for possible LPR. He underwent EGD in October 2013 revealing small submucosal lipoma at the UES and abnormal appearance to the ampulla of Vater. Biopsy revealed benign tissue.     Current Medications: Current Outpatient Prescriptions  Medication Sig Dispense Refill  . amLODipine (NORVASC) 5 MG tablet Take 5 mg by mouth daily.      Marland Kitchen aspirin 81 MG tablet Take 81 mg by mouth daily.      Marland Kitchen losartan (COZAAR) 100 MG tablet Take 100 mg by mouth daily.      . pantoprazole (PROTONIX) 40 MG tablet Take 1 tablet (40 mg total) by mouth daily.  60  tablet  3  . simvastatin (ZOCOR) 20 MG tablet Take 20 mg by mouth every evening.      . Tamsulosin HCl (FLOMAX) 0.4 MG CAPS Take by mouth daily.        No current facility-administered medications for this visit.     Objective: Blood pressure 116/74, pulse 70, temperature 97.7 F (36.5 C), temperature source Oral, resp. rate 16, height 5\' 11"  (1.803 m), weight 182 lb 14.4 oz (82.963 kg). Patient is alert and in no acute distress. Conjunctiva is pink. Sclera is nonicteric Oropharyngeal mucosa is normal. No neck masses or thyromegaly noted. Cardiac exam with regular rhythm normal S1 and S2. No murmur or gallop noted. Lungs are clear to auscultation. Abdomen is soft and nontender without organomegaly or masses.  No LE edema or clubbing noted.   Assessment:  #1. Excessive salivation of unclear etiology without response to PPI and lorazepam. He is not a candidate for anti-cholinergic therapy. #2. Weight loss. He has not lost any weight since his last visit 3 months ago. #3. PPI therapy. He was begun on PPI therapy for possible LPR by Dr.Teoh. He does not have typical or atypical symptoms of GERD. It would therefore be reasonable to taper them off PPI. #4.   Plan:  Pantoprazole 40 mg by mouth every morning for one month followed by pantoprazole 40 mg every other day for one month and stop. Call if symptoms relapse. Office visit in 6 months.

## 2013-01-03 DIAGNOSIS — R972 Elevated prostate specific antigen [PSA]: Secondary | ICD-10-CM | POA: Diagnosis not present

## 2013-02-17 DIAGNOSIS — Z23 Encounter for immunization: Secondary | ICD-10-CM | POA: Diagnosis not present

## 2013-02-24 ENCOUNTER — Other Ambulatory Visit: Payer: Self-pay | Admitting: Dermatology

## 2013-02-24 DIAGNOSIS — L723 Sebaceous cyst: Secondary | ICD-10-CM | POA: Diagnosis not present

## 2013-02-24 DIAGNOSIS — C4441 Basal cell carcinoma of skin of scalp and neck: Secondary | ICD-10-CM | POA: Diagnosis not present

## 2013-02-24 DIAGNOSIS — Z85828 Personal history of other malignant neoplasm of skin: Secondary | ICD-10-CM | POA: Diagnosis not present

## 2013-02-24 DIAGNOSIS — D239 Other benign neoplasm of skin, unspecified: Secondary | ICD-10-CM | POA: Diagnosis not present

## 2013-02-24 DIAGNOSIS — L570 Actinic keratosis: Secondary | ICD-10-CM | POA: Diagnosis not present

## 2013-02-24 DIAGNOSIS — D485 Neoplasm of uncertain behavior of skin: Secondary | ICD-10-CM | POA: Diagnosis not present

## 2013-04-04 DIAGNOSIS — N320 Bladder-neck obstruction: Secondary | ICD-10-CM | POA: Diagnosis not present

## 2013-04-04 DIAGNOSIS — Z809 Family history of malignant neoplasm, unspecified: Secondary | ICD-10-CM | POA: Diagnosis not present

## 2013-04-04 DIAGNOSIS — E7800 Pure hypercholesterolemia, unspecified: Secondary | ICD-10-CM | POA: Diagnosis not present

## 2013-04-04 DIAGNOSIS — I10 Essential (primary) hypertension: Secondary | ICD-10-CM | POA: Diagnosis not present

## 2013-04-04 DIAGNOSIS — C61 Malignant neoplasm of prostate: Secondary | ICD-10-CM | POA: Diagnosis not present

## 2013-04-04 DIAGNOSIS — K4090 Unilateral inguinal hernia, without obstruction or gangrene, not specified as recurrent: Secondary | ICD-10-CM | POA: Diagnosis not present

## 2013-04-04 DIAGNOSIS — K4191 Unilateral femoral hernia, without obstruction or gangrene, recurrent: Secondary | ICD-10-CM | POA: Diagnosis not present

## 2013-04-04 DIAGNOSIS — R972 Elevated prostate specific antigen [PSA]: Secondary | ICD-10-CM | POA: Diagnosis not present

## 2013-05-20 DIAGNOSIS — R413 Other amnesia: Secondary | ICD-10-CM | POA: Diagnosis not present

## 2013-05-20 DIAGNOSIS — I10 Essential (primary) hypertension: Secondary | ICD-10-CM | POA: Diagnosis not present

## 2013-06-14 ENCOUNTER — Other Ambulatory Visit (HOSPITAL_COMMUNITY): Payer: Self-pay | Admitting: Internal Medicine

## 2013-06-14 DIAGNOSIS — R413 Other amnesia: Secondary | ICD-10-CM

## 2013-06-15 DIAGNOSIS — R413 Other amnesia: Secondary | ICD-10-CM | POA: Diagnosis not present

## 2013-06-20 ENCOUNTER — Ambulatory Visit (HOSPITAL_COMMUNITY)
Admission: RE | Admit: 2013-06-20 | Discharge: 2013-06-20 | Disposition: A | Discharge status: Home / Self Care | Payer: Medicare Other | Source: Ambulatory Visit | Attending: Internal Medicine | Admitting: Internal Medicine

## 2013-06-20 DIAGNOSIS — R413 Other amnesia: Secondary | ICD-10-CM

## 2013-06-20 DIAGNOSIS — G319 Degenerative disease of nervous system, unspecified: Secondary | ICD-10-CM | POA: Insufficient documentation

## 2013-06-20 DIAGNOSIS — R040 Epistaxis: Secondary | ICD-10-CM | POA: Diagnosis not present

## 2013-06-20 DIAGNOSIS — R51 Headache: Secondary | ICD-10-CM | POA: Insufficient documentation

## 2013-06-24 DIAGNOSIS — R413 Other amnesia: Secondary | ICD-10-CM | POA: Diagnosis not present

## 2013-06-28 ENCOUNTER — Ambulatory Visit (INDEPENDENT_AMBULATORY_CARE_PROVIDER_SITE_OTHER): Payer: Medicare Other | Admitting: Internal Medicine

## 2013-07-11 ENCOUNTER — Ambulatory Visit (INDEPENDENT_AMBULATORY_CARE_PROVIDER_SITE_OTHER): Payer: Medicare Other | Admitting: Internal Medicine

## 2013-08-04 ENCOUNTER — Ambulatory Visit (INDEPENDENT_AMBULATORY_CARE_PROVIDER_SITE_OTHER): Payer: Medicare Other

## 2013-08-04 ENCOUNTER — Ambulatory Visit (INDEPENDENT_AMBULATORY_CARE_PROVIDER_SITE_OTHER): Payer: Medicare Other | Admitting: Orthopedic Surgery

## 2013-08-04 VITALS — BP 117/71 | Ht 71.0 in | Wt 180.0 lb

## 2013-08-04 DIAGNOSIS — M79609 Pain in unspecified limb: Secondary | ICD-10-CM

## 2013-08-04 DIAGNOSIS — M6530 Trigger finger, unspecified finger: Secondary | ICD-10-CM | POA: Insufficient documentation

## 2013-08-04 DIAGNOSIS — M79643 Pain in unspecified hand: Secondary | ICD-10-CM

## 2013-08-04 DIAGNOSIS — M19049 Primary osteoarthritis, unspecified hand: Secondary | ICD-10-CM | POA: Insufficient documentation

## 2013-08-04 NOTE — Patient Instructions (Addendum)
You have received a steroid shot. 15% of patients experience increased pain at the injection site with in the next 24 hours. This is best treated with ice and tylenol extra strength 2 tabs every 8 hours. If you are still having pain please call the office.    aspercreme  Or capzacin for finger

## 2013-08-04 NOTE — Progress Notes (Signed)
Patient ID: John Huffman, male   DOB: 1929/01/25, 80 y.o.   MRN: [id number] Chief Complaint  Patient presents with  . Hand Pain    Right hand pain, no injury.    80 year old male who presents with pain over the right index finger the minute carpal phalangeal joint and inability to flex his index finger same digit. Symptoms started several months ago treated with topical rubs and creams over-the-counter did not improve now presents with continued pain at the same joint. During the wintertime he had acute swelling of the joint it has since subsided  His only complaints on review of systems is joint pain  Medical history reviewed. No major medical problems to speak of he take some amlodipine and losartan for blood pressure he is married doesn't smoke or drink  BP 117/71  Ht 5\' 11"  (1.803 m)  Wt 180 lb (81.647 kg)  BMI 25.12 kg/m2 General appearance is normal, the patient is alert and oriented x3 with normal mood and affect. Evaluation of that index finger right hand shows tenderness crepitus and decreased range of motion of the metacarpophalangeal joint with inability to flex the index finger and tenderness over the A1 pulley, flexor tendon power however is intact FDP and FDS skin is normal good capillary refill color and sensation  No lymph nodes are palpable in the right upper extremity  X-rays show arthritis intercarpal joint and index metacarpal phalangeal joint   Arthritis right hand Trigger finger right hand   Recommend injection to hopefully improve triggering  Recommend topical capsaicin or Aspercreme  Return in 2 weeks to reexamine

## 2013-08-08 DIAGNOSIS — L989 Disorder of the skin and subcutaneous tissue, unspecified: Secondary | ICD-10-CM | POA: Diagnosis not present

## 2013-08-08 DIAGNOSIS — B079 Viral wart, unspecified: Secondary | ICD-10-CM | POA: Diagnosis not present

## 2013-08-18 ENCOUNTER — Ambulatory Visit (INDEPENDENT_AMBULATORY_CARE_PROVIDER_SITE_OTHER): Payer: Medicare Other | Admitting: Orthopedic Surgery

## 2013-08-18 ENCOUNTER — Encounter: Payer: Self-pay | Admitting: Orthopedic Surgery

## 2013-08-18 VITALS — BP 128/70 | Ht 71.0 in | Wt 180.0 lb

## 2013-08-18 DIAGNOSIS — M6530 Trigger finger, unspecified finger: Secondary | ICD-10-CM

## 2013-08-18 DIAGNOSIS — M79609 Pain in unspecified limb: Secondary | ICD-10-CM

## 2013-08-18 DIAGNOSIS — M19049 Primary osteoarthritis, unspecified hand: Secondary | ICD-10-CM | POA: Diagnosis not present

## 2013-08-18 DIAGNOSIS — M79643 Pain in unspecified hand: Secondary | ICD-10-CM

## 2013-08-18 NOTE — Progress Notes (Signed)
Patient ID: John Huffman, male   DOB: [birth year], 80 y.o.   MRN: [id number]  Chief Complaint  Patient presents with  . Follow-up    2 week recheck on right hand index finger, trigger. S/p injection.    BP 128/70  Ht 5\' 11"  (1.803 m)  Wt 180 lb (81.647 kg)  BMI 25.12 kg/m2  Encounter Diagnoses  Name Primary?  . Hand pain Yes  . Arthritis of hand   . Trigger finger of right hand     Patient reports that his finger is better although his motion at the metacarpophalangeal joints without tenderness or restriction of motion in that area  Recommend

## 2013-08-19 DIAGNOSIS — R413 Other amnesia: Secondary | ICD-10-CM | POA: Diagnosis not present

## 2013-08-25 ENCOUNTER — Other Ambulatory Visit: Payer: Self-pay | Admitting: Dermatology

## 2013-08-25 DIAGNOSIS — D239 Other benign neoplasm of skin, unspecified: Secondary | ICD-10-CM | POA: Diagnosis not present

## 2013-08-25 DIAGNOSIS — L820 Inflamed seborrheic keratosis: Secondary | ICD-10-CM | POA: Diagnosis not present

## 2013-08-25 DIAGNOSIS — L723 Sebaceous cyst: Secondary | ICD-10-CM | POA: Diagnosis not present

## 2013-08-25 DIAGNOSIS — Z85828 Personal history of other malignant neoplasm of skin: Secondary | ICD-10-CM | POA: Diagnosis not present

## 2013-08-25 DIAGNOSIS — L821 Other seborrheic keratosis: Secondary | ICD-10-CM | POA: Diagnosis not present

## 2013-08-25 DIAGNOSIS — C44319 Basal cell carcinoma of skin of other parts of face: Secondary | ICD-10-CM | POA: Diagnosis not present

## 2013-08-25 DIAGNOSIS — L570 Actinic keratosis: Secondary | ICD-10-CM | POA: Diagnosis not present

## 2013-08-25 DIAGNOSIS — D485 Neoplasm of uncertain behavior of skin: Secondary | ICD-10-CM | POA: Diagnosis not present

## 2013-09-19 ENCOUNTER — Ambulatory Visit (INDEPENDENT_AMBULATORY_CARE_PROVIDER_SITE_OTHER): Payer: Medicare Other | Admitting: Internal Medicine

## 2013-09-19 ENCOUNTER — Encounter (INDEPENDENT_AMBULATORY_CARE_PROVIDER_SITE_OTHER): Payer: Self-pay | Admitting: Internal Medicine

## 2013-09-19 VITALS — BP 118/72 | HR 76 | Temp 98.1°F | Resp 16 | Ht 71.0 in | Wt 179.0 lb

## 2013-09-19 DIAGNOSIS — K219 Gastro-esophageal reflux disease without esophagitis: Secondary | ICD-10-CM

## 2013-09-19 DIAGNOSIS — K117 Disturbances of salivary secretion: Secondary | ICD-10-CM | POA: Diagnosis not present

## 2013-09-19 NOTE — Patient Instructions (Signed)
Call if symptoms change.

## 2013-09-19 NOTE — Progress Notes (Signed)
Presenting complaint;  Followup for excessive salivation. History of GERD.  Subjective:  Patient is a 81 year old Caucasian male who presents for scheduled visit. He was last seen in September 2014. He remains with excessive salivation. However he feels he is able to cope with this symptom better than he was able to in the past. He he has no difficulty or need to spit saliva when he's playing golf or at church service. Similarly if he sleeps with his mouth closed he has no difficulty. He states when he is taking shower he seemed to produce lot more saliva. He does not experience heartburn while on PPI. He has had intermittent hoarseness like today. He denies dysphagia nausea vomiting nocturnal regurgitation or anorexia. He has lost 3 pounds since his last visit. He is having memory issues and he was begun on Donezepil by Dr. Willey Blade one month ago but so far he cannot tell any difference. He's hoping that medication will prevent further memory loss. He is not having any side effects with pantoprazole.  Current Medications: Outpatient Encounter Prescriptions as of 09/19/2013  Medication Sig  . amLODipine (NORVASC) 5 MG tablet Take 5 mg by mouth daily.  Marland Kitchen aspirin 81 MG tablet Take 81 mg by mouth daily.  Marland Kitchen donepezil (ARICEPT) 5 MG tablet Take 5 mg by mouth daily.  Marland Kitchen losartan (COZAAR) 100 MG tablet Take 100 mg by mouth daily.  . pantoprazole (PROTONIX) 40 MG tablet Take 1 tablet (40 mg total) by mouth daily.  . simvastatin (ZOCOR) 20 MG tablet Take 20 mg by mouth every evening.  . Tamsulosin HCl (FLOMAX) 0.4 MG CAPS Take by mouth daily.      Objective: Blood pressure 118/72, pulse 76, temperature 98.1 F (36.7 C), temperature source Oral, resp. rate 16, height 5\' 11"  (1.803 m), weight 179 lb (81.194 kg). Patient is alert and in no acute distress. Conjunctiva is pink. Sclera is nonicteric Oropharyngeal mucosa is normal. No neck masses or thyromegaly noted. Cardiac exam with regular rhythm  normal S1 and S2. No murmur or gallop noted. Lungs are clear to auscultation. Abdomen symmetrical soft and nontender without organomegaly or masses. No LE edema or clubbing noted.    Assessment:  #1. Hypersalivation without clear-cut etiology. Workup has been negative including evaluation by Dr. Benjamine Mola and he also had negative unenhanced head CT 3 months ago. He's not a candidate for anti-cholinergic therapy which I am afraid will ask problems. #2. GERD. Symptoms are well controlled with therapy and I do not believe hypersalivation is secondary to GERD.    Plan:  Continue pantoprazole at current dose. Call if symptoms change otherwise followup visit in 6 months.

## 2013-10-03 DIAGNOSIS — N320 Bladder-neck obstruction: Secondary | ICD-10-CM | POA: Diagnosis not present

## 2013-10-03 DIAGNOSIS — C61 Malignant neoplasm of prostate: Secondary | ICD-10-CM | POA: Diagnosis not present

## 2013-10-03 DIAGNOSIS — K4090 Unilateral inguinal hernia, without obstruction or gangrene, not specified as recurrent: Secondary | ICD-10-CM | POA: Diagnosis not present

## 2013-10-03 DIAGNOSIS — R972 Elevated prostate specific antigen [PSA]: Secondary | ICD-10-CM | POA: Diagnosis not present

## 2013-10-24 IMAGING — CT CT ABD-PELV W/ CM
2 of 5 series · 16 of 46 positions shown, 18 images · IV contrast (Omnipaque 300)
Comparison: None.

CLINICAL DATA: Right abdominal pain and weight loss.  History of
prostate cancer.

CT ABDOMEN AND PELVIS WITH CONTRAST
TECHNIQUE: Multidetector CT imaging of the abdomen and pelvis was
performed following the standard protocol during bolus
administration of intravenous contrast.
Contrast: 100mL OMNIPAQUE IOHEXOL 300 MG/ML  SOLN

[Series 2: abd_pel_with 5.0 b40f · axial · 0.80mm/px · z∈[-446,+4]mm · 13 of 102 slices shown, 15 images]
[im 6/102  soft-tissue]
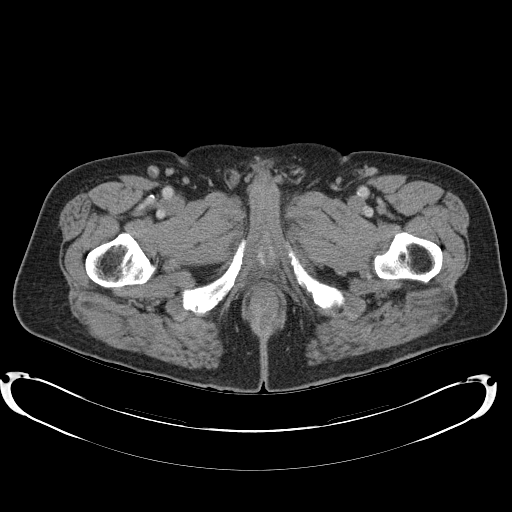
[im 6/102  bone]
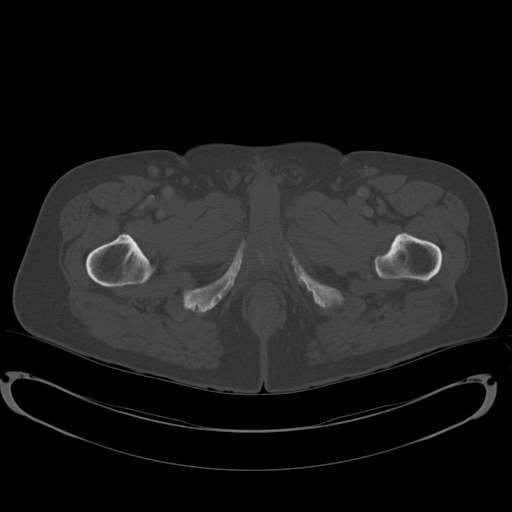
[im 12/102  soft-tissue]
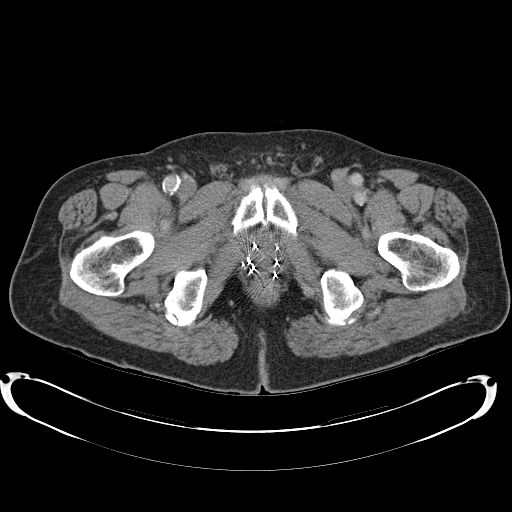
[im 24/102  soft-tissue]
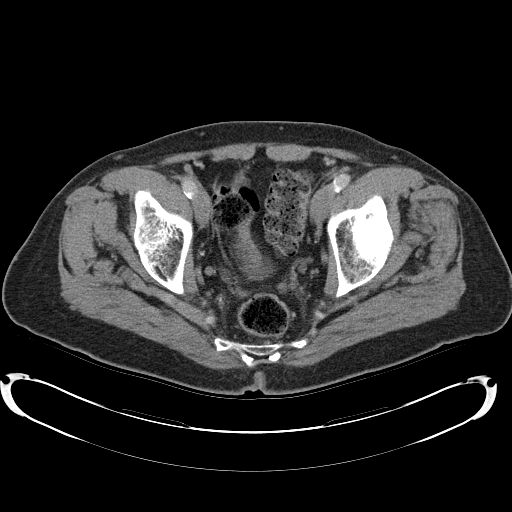
[im 30/102  soft-tissue]
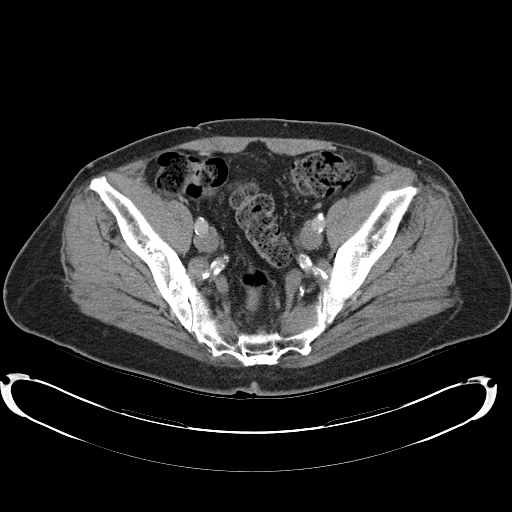
[im 36/102  soft-tissue]
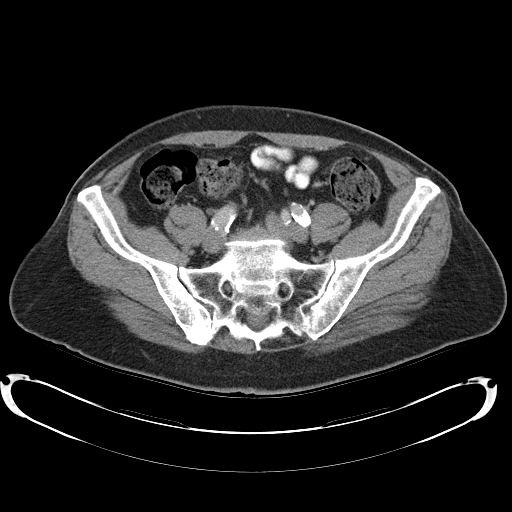
[im 42/102  soft-tissue]
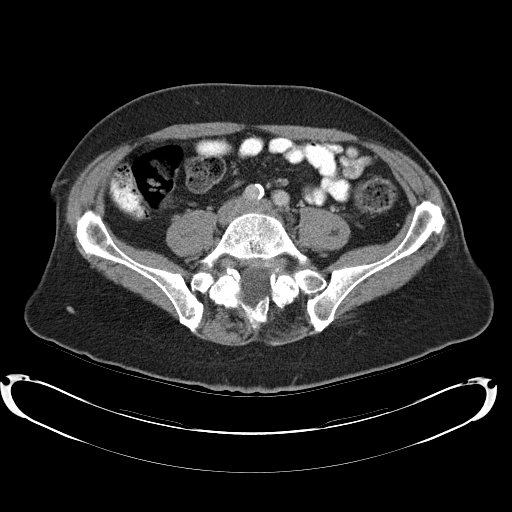
[im 54/102  soft-tissue]
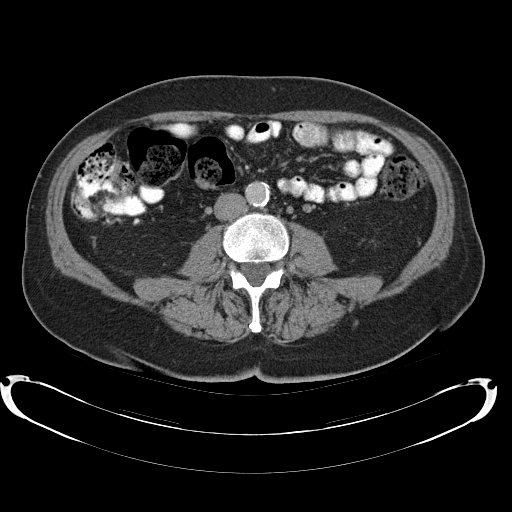
[im 60/102  soft-tissue]
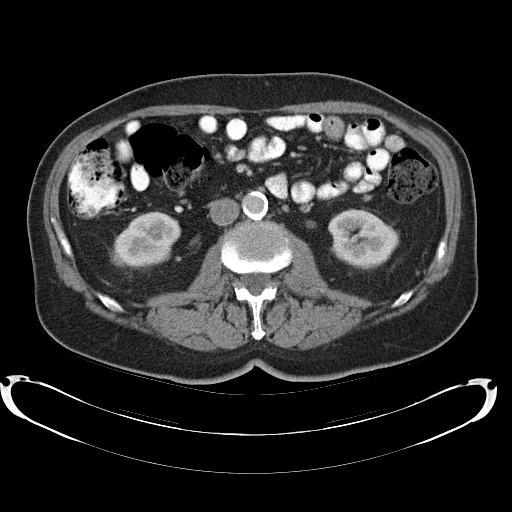
[im 66/102  soft-tissue]
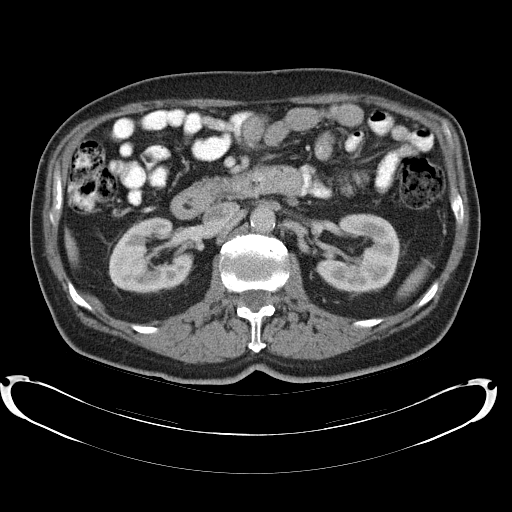
[im 66/102  bone]
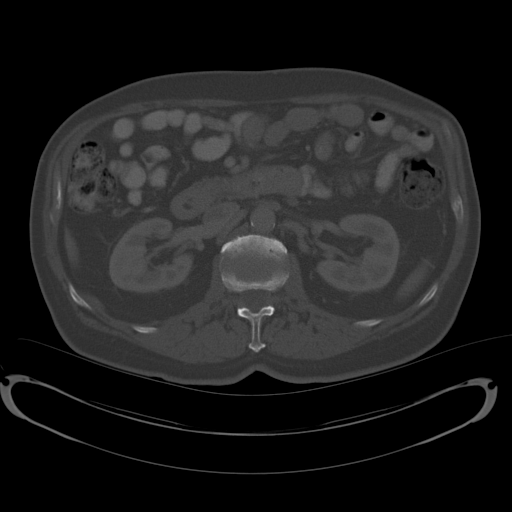
[im 72/102  soft-tissue]
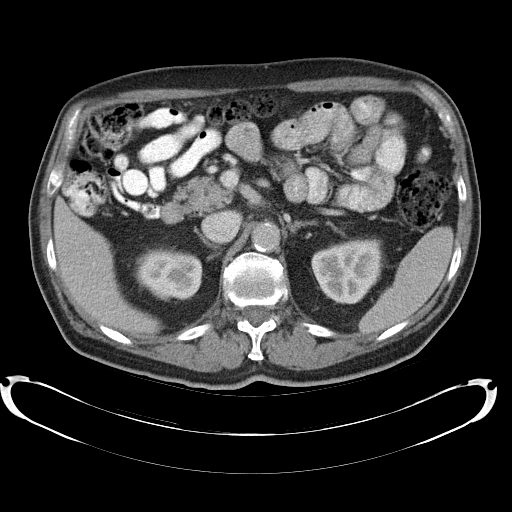
[im 78/102  soft-tissue]
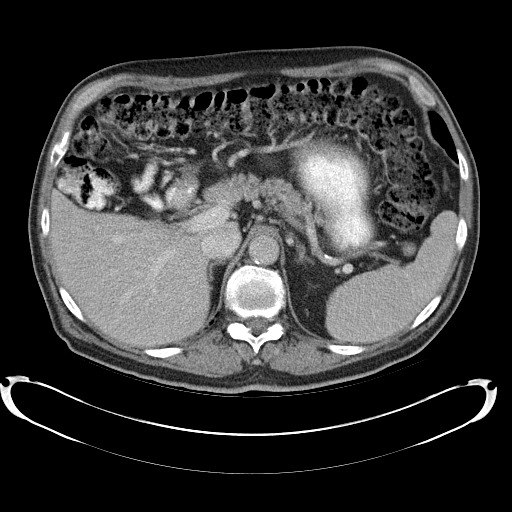
[im 90/102  soft-tissue]
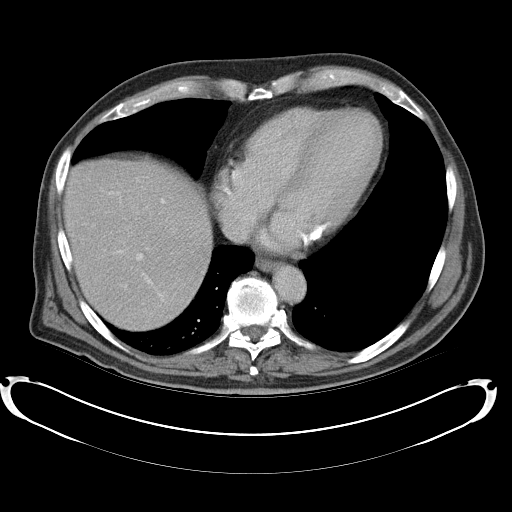
[im 96/102  soft-tissue]
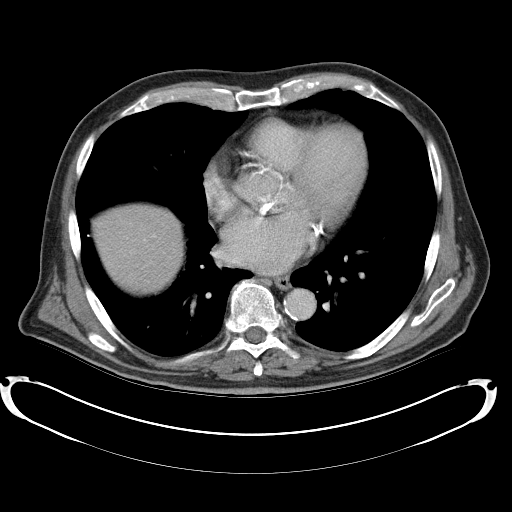

[Series 4: abd_pel_with 3.0 spo cor · coronal · 0.79mm/px · 3 of 88 slices shown]
[im 30/88  soft-tissue]
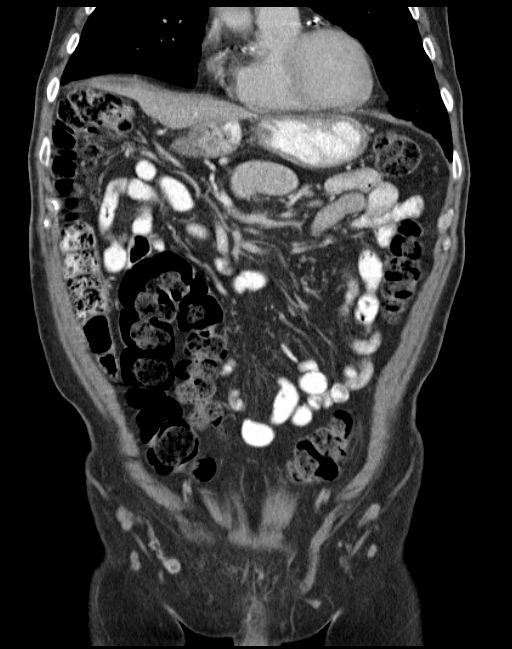
[im 39/88  soft-tissue]
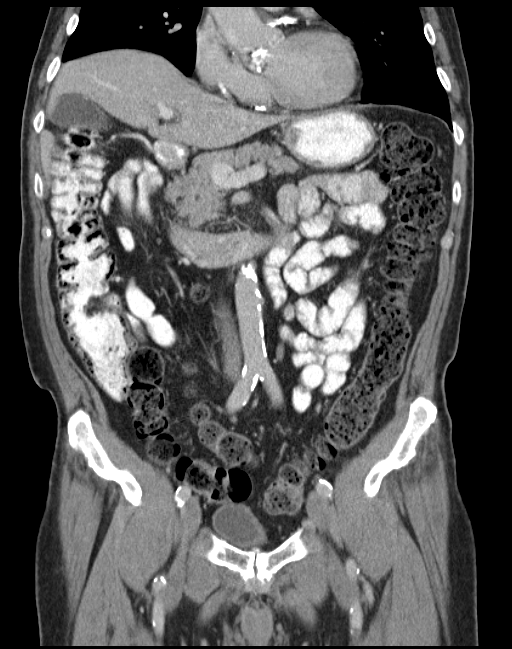
[im 49/88  soft-tissue]
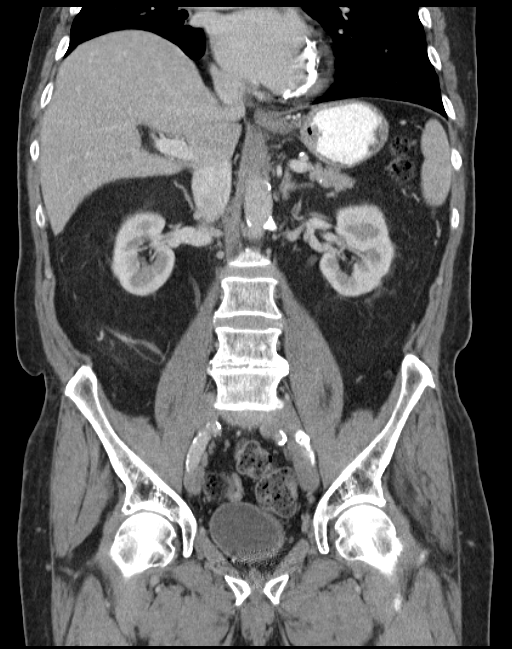

[16 of 46 positions shown; findings below may reference images not displayed]

FINDINGS: There is mild atelectasis at the right lung base.  Small
calcification at the right lung base.  Left coronary arteries are
heavily calcified.  There are mitral annular calcifications.  No
evidence for pneumoperitoneum.

No gross abnormality to the liver, gallbladder or portal venous
system.  Normal appearance of the spleen, pancreas, adrenal glands
and kidneys.  No evidence for hydronephrosis.  There may be a
lipoma in the second portion of the duodenum.  No significant free
fluid or lymphadenopathy.  There are atherosclerotic calcifications
of the aorta and visceral arteries.  There could be narrowing at
the origin of the superior mesenteric artery.

Prostate brachytherapy seeds are present.  There is a large amount
stool in the colon.  No evidence for colonic inflammation.  There
is no significant small bowel dilatation or bowel wall thickening.

Laminotomy changes in the lower lumbar spine.  Sclerosis along the
posterior aspect of the right iliac bone.   There is marked disc
space loss at L4-L5.  Bilateral pars defects at L5 with mild
anterolisthesis at L5-S1.  Diffuse osteopenia of the vertebral
bodies.
IMPRESSION: No acute abnormalities within the abdomen or pelvis.

Sclerosis involving the posterior right iliac bone of uncertain
etiology.  This finding is not typical for postsurgical bone
harvest site. A sclerotic bone metastasis cannot be excluded.
Recommend correlating with PSA level and possibly a bone scan.

Postsurgical changes in the lower lumbar spine.  Pars defects at
L5.

Large amount of stool throughout the colon.

Atherosclerotic calcifications involving the visceral arteries and
coronary arteries.  Difficult to exclude stenosis of the superior
mesenteric artery.  If there is clinical concern for mesenteric
ischemia, consider a CTA examination.

## 2013-11-11 DIAGNOSIS — H6120 Impacted cerumen, unspecified ear: Secondary | ICD-10-CM | POA: Diagnosis not present

## 2013-11-11 DIAGNOSIS — H60399 Other infective otitis externa, unspecified ear: Secondary | ICD-10-CM | POA: Diagnosis not present

## 2013-11-18 DIAGNOSIS — E785 Hyperlipidemia, unspecified: Secondary | ICD-10-CM | POA: Diagnosis not present

## 2013-11-18 DIAGNOSIS — R413 Other amnesia: Secondary | ICD-10-CM | POA: Diagnosis not present

## 2013-11-18 DIAGNOSIS — Z79899 Other long term (current) drug therapy: Secondary | ICD-10-CM | POA: Diagnosis not present

## 2013-11-18 DIAGNOSIS — I10 Essential (primary) hypertension: Secondary | ICD-10-CM | POA: Diagnosis not present

## 2013-11-18 DIAGNOSIS — R7301 Impaired fasting glucose: Secondary | ICD-10-CM | POA: Diagnosis not present

## 2013-11-18 DIAGNOSIS — C61 Malignant neoplasm of prostate: Secondary | ICD-10-CM | POA: Diagnosis not present

## 2013-11-25 DIAGNOSIS — Z0000 Encounter for general adult medical examination without abnormal findings: Secondary | ICD-10-CM | POA: Diagnosis not present

## 2013-11-25 DIAGNOSIS — Z23 Encounter for immunization: Secondary | ICD-10-CM | POA: Diagnosis not present

## 2014-01-02 DIAGNOSIS — C61 Malignant neoplasm of prostate: Secondary | ICD-10-CM | POA: Diagnosis not present

## 2014-01-11 DIAGNOSIS — M25569 Pain in unspecified knee: Secondary | ICD-10-CM | POA: Diagnosis not present

## 2014-01-11 DIAGNOSIS — Z23 Encounter for immunization: Secondary | ICD-10-CM | POA: Diagnosis not present

## 2014-03-02 ENCOUNTER — Other Ambulatory Visit: Payer: Self-pay | Admitting: Dermatology

## 2014-03-02 DIAGNOSIS — D225 Melanocytic nevi of trunk: Secondary | ICD-10-CM | POA: Diagnosis not present

## 2014-03-02 DIAGNOSIS — L570 Actinic keratosis: Secondary | ICD-10-CM | POA: Diagnosis not present

## 2014-03-02 DIAGNOSIS — Z85828 Personal history of other malignant neoplasm of skin: Secondary | ICD-10-CM | POA: Diagnosis not present

## 2014-03-02 DIAGNOSIS — D485 Neoplasm of uncertain behavior of skin: Secondary | ICD-10-CM | POA: Diagnosis not present

## 2014-03-02 DIAGNOSIS — C44319 Basal cell carcinoma of skin of other parts of face: Secondary | ICD-10-CM | POA: Diagnosis not present

## 2014-03-02 DIAGNOSIS — D1801 Hemangioma of skin and subcutaneous tissue: Secondary | ICD-10-CM | POA: Diagnosis not present

## 2014-03-02 DIAGNOSIS — L723 Sebaceous cyst: Secondary | ICD-10-CM | POA: Diagnosis not present

## 2014-03-10 DIAGNOSIS — M459 Ankylosing spondylitis of unspecified sites in spine: Secondary | ICD-10-CM | POA: Diagnosis not present

## 2014-03-10 DIAGNOSIS — R413 Other amnesia: Secondary | ICD-10-CM | POA: Diagnosis not present

## 2014-03-10 DIAGNOSIS — I10 Essential (primary) hypertension: Secondary | ICD-10-CM | POA: Diagnosis not present

## 2014-03-10 DIAGNOSIS — M1990 Unspecified osteoarthritis, unspecified site: Secondary | ICD-10-CM | POA: Diagnosis not present

## 2014-03-15 ENCOUNTER — Ambulatory Visit (HOSPITAL_COMMUNITY)
Admission: RE | Admit: 2014-03-15 | Discharge: 2014-03-15 | Disposition: A | Discharge status: Home / Self Care | Payer: Medicare Other | Source: Ambulatory Visit | Attending: Internal Medicine | Admitting: Internal Medicine

## 2014-03-15 DIAGNOSIS — I10 Essential (primary) hypertension: Secondary | ICD-10-CM | POA: Insufficient documentation

## 2014-03-15 DIAGNOSIS — I359 Nonrheumatic aortic valve disorder, unspecified: Secondary | ICD-10-CM | POA: Diagnosis not present

## 2014-03-15 DIAGNOSIS — I350 Nonrheumatic aortic (valve) stenosis: Secondary | ICD-10-CM | POA: Insufficient documentation

## 2014-03-15 DIAGNOSIS — E785 Hyperlipidemia, unspecified: Secondary | ICD-10-CM | POA: Insufficient documentation

## 2014-03-15 DIAGNOSIS — I081 Rheumatic disorders of both mitral and tricuspid valves: Secondary | ICD-10-CM | POA: Insufficient documentation

## 2014-03-15 DIAGNOSIS — R011 Cardiac murmur, unspecified: Secondary | ICD-10-CM | POA: Diagnosis present

## 2014-03-15 NOTE — Progress Notes (Signed)
  Echocardiogram 2D Echocardiogram has been performed.  [place], [place] 03/15/2014, 12:46 PM

## 2014-03-21 ENCOUNTER — Ambulatory Visit (INDEPENDENT_AMBULATORY_CARE_PROVIDER_SITE_OTHER): Payer: Medicare Other | Admitting: Internal Medicine

## 2014-04-03 DIAGNOSIS — N320 Bladder-neck obstruction: Secondary | ICD-10-CM | POA: Diagnosis not present

## 2014-04-03 DIAGNOSIS — C61 Malignant neoplasm of prostate: Secondary | ICD-10-CM | POA: Diagnosis not present

## 2014-04-03 DIAGNOSIS — R972 Elevated prostate specific antigen [PSA]: Secondary | ICD-10-CM | POA: Diagnosis not present

## 2014-04-03 DIAGNOSIS — K4090 Unilateral inguinal hernia, without obstruction or gangrene, not specified as recurrent: Secondary | ICD-10-CM | POA: Diagnosis not present

## 2014-04-24 DIAGNOSIS — M25561 Pain in right knee: Secondary | ICD-10-CM | POA: Diagnosis not present

## 2014-06-08 DIAGNOSIS — M1711 Unilateral primary osteoarthritis, right knee: Secondary | ICD-10-CM | POA: Diagnosis not present

## 2014-06-15 DIAGNOSIS — M1990 Unspecified osteoarthritis, unspecified site: Secondary | ICD-10-CM | POA: Diagnosis not present

## 2014-06-15 DIAGNOSIS — I10 Essential (primary) hypertension: Secondary | ICD-10-CM | POA: Diagnosis not present

## 2014-06-28 DIAGNOSIS — M1711 Unilateral primary osteoarthritis, right knee: Secondary | ICD-10-CM | POA: Diagnosis not present

## 2014-07-03 DIAGNOSIS — C61 Malignant neoplasm of prostate: Secondary | ICD-10-CM | POA: Diagnosis not present

## 2014-07-03 DIAGNOSIS — R972 Elevated prostate specific antigen [PSA]: Secondary | ICD-10-CM | POA: Diagnosis not present

## 2014-07-05 DIAGNOSIS — M1711 Unilateral primary osteoarthritis, right knee: Secondary | ICD-10-CM | POA: Diagnosis not present

## 2014-07-12 DIAGNOSIS — M1711 Unilateral primary osteoarthritis, right knee: Secondary | ICD-10-CM | POA: Diagnosis not present

## 2014-08-28 DIAGNOSIS — M1711 Unilateral primary osteoarthritis, right knee: Secondary | ICD-10-CM | POA: Diagnosis not present

## 2014-09-04 DIAGNOSIS — D225 Melanocytic nevi of trunk: Secondary | ICD-10-CM | POA: Diagnosis not present

## 2014-09-04 DIAGNOSIS — D1801 Hemangioma of skin and subcutaneous tissue: Secondary | ICD-10-CM | POA: Diagnosis not present

## 2014-09-04 DIAGNOSIS — Z85828 Personal history of other malignant neoplasm of skin: Secondary | ICD-10-CM | POA: Diagnosis not present

## 2014-09-04 DIAGNOSIS — L821 Other seborrheic keratosis: Secondary | ICD-10-CM | POA: Diagnosis not present

## 2014-09-04 DIAGNOSIS — L723 Sebaceous cyst: Secondary | ICD-10-CM | POA: Diagnosis not present

## 2014-09-04 DIAGNOSIS — L820 Inflamed seborrheic keratosis: Secondary | ICD-10-CM | POA: Diagnosis not present

## 2014-09-26 ENCOUNTER — Ambulatory Visit (INDEPENDENT_AMBULATORY_CARE_PROVIDER_SITE_OTHER): Payer: Medicare Other | Admitting: Internal Medicine

## 2014-09-26 ENCOUNTER — Encounter (INDEPENDENT_AMBULATORY_CARE_PROVIDER_SITE_OTHER): Payer: Self-pay | Admitting: Internal Medicine

## 2014-09-26 VITALS — BP 114/70 | HR 76 | Temp 98.2°F | Resp 18 | Ht 71.0 in | Wt 178.2 lb

## 2014-09-26 DIAGNOSIS — K117 Disturbances of salivary secretion: Secondary | ICD-10-CM

## 2014-09-26 DIAGNOSIS — K219 Gastro-esophageal reflux disease without esophagitis: Secondary | ICD-10-CM | POA: Diagnosis not present

## 2014-09-26 NOTE — Progress Notes (Signed)
Presenting complaint;  Follow-up for hypersalivation and GERD.  Subjective:  Patient is 81 year old Caucasian male who is here for yearly visit. He reports no change in excessive salivation and tears. He states he has learned to live with it. He takes more saliva when his washing her taking shower. He he has to clear his throat often. He denies heartburn dysphagia nausea vomiting or sore throat. He is having constant right knee pain. He states he has no cartilage left to his anterior part of the joint. He has an appointment to see Dr. Reynaldo Minium next week. He states his appetite is not normal. His weight is down only by 1 pound since his last visit. [facility] and [facility] body exercises 4 times a week. He is not having any problems with left inguinal hernia. He states it has not increased in size since he was last seen. His bowels move every other day and he denies melena or rectal bleeding. He wonders if he could come off any of his medications.  Current Medications: Outpatient Encounter Prescriptions as of 09/26/2014  Medication Sig  . amLODipine (NORVASC) 5 MG tablet Take 5 mg by mouth daily.  Marland Kitchen aspirin 81 MG tablet Take 81 mg by mouth daily.  Marland Kitchen donepezil (ARICEPT) 5 MG tablet Take 5 mg by mouth daily.  Marland Kitchen losartan (COZAAR) 100 MG tablet Take 100 mg by mouth daily.  . pantoprazole (PROTONIX) 40 MG tablet Take 1 tablet (40 mg total) by mouth daily.  . simvastatin (ZOCOR) 20 MG tablet Take 20 mg by mouth every evening.  . Tamsulosin HCl (FLOMAX) 0.4 MG CAPS Take by mouth daily.    No facility-administered encounter medications on file as of 09/26/2014.     Objective: Blood pressure 114/70, pulse 76, temperature 98.2 F (36.8 C), temperature source Oral, resp. rate 18, height 5\' 11"  (1.803 m), weight 178 lb 3.2 oz (80.831 kg). Patient is alert and in no acute distress. Conjunctiva is pink. Sclera is nonicteric Oropharyngeal mucosa is normal. No neck masses or thyromegaly noted. Cardiac  exam with regular rhythm normal S1 and S2. Grade 3/6 systolic ejection murmur best heard over aortic area. Lungs are clear to auscultation. Abdomen. Left inguinal hernia noted it is soft nontender and reducible. Abdomen is soft and nontender without organomegaly or masses. No LE edema or clubbing noted.   Assessment:  #1. Hypersalivation of unknown etiology. He is nonicteric and it for anticholinergic therapy. Hypersalivation does not appear to be due to GERD. If hypersalivation became intractable would consider empiric therapy with prednisone. #2. GERD. He is on pantoprazole and presently not having any GI symptoms. He had EGD in October 2013 revealing small sliding hiatal hernia but no evidence of erosive esophagitis. 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 #3. Left inguinal hernia. Patient aware to support left groin when he is straining. He may want to talk with Dr. Willey Blade whether or not he should undergo elective surgery. #4. Weight loss. His weight has remained stable over the last one year. Since September 2013 he has lost 10 pounds.  Plan:  Decrease pantoprazole to 40 mg every other day. Patient will call with progress report in 2 months. Unless symptoms relapse will use pantoprazole on as-needed basis. Office visit in one year.

## 2014-09-26 NOTE — Patient Instructions (Signed)
Take pantoprazole every other day. Can go back on daily schedule if you start to experience frequent heartburn and/or regurgitation. Call office with progress report in 2 months.

## 2014-10-02 DIAGNOSIS — N320 Bladder-neck obstruction: Secondary | ICD-10-CM | POA: Diagnosis not present

## 2014-10-02 DIAGNOSIS — Z8546 Personal history of malignant neoplasm of prostate: Secondary | ICD-10-CM | POA: Diagnosis not present

## 2014-10-02 DIAGNOSIS — K4090 Unilateral inguinal hernia, without obstruction or gangrene, not specified as recurrent: Secondary | ICD-10-CM | POA: Diagnosis not present

## 2014-10-02 DIAGNOSIS — C61 Malignant neoplasm of prostate: Secondary | ICD-10-CM | POA: Diagnosis not present

## 2014-10-02 DIAGNOSIS — R972 Elevated prostate specific antigen [PSA]: Secondary | ICD-10-CM | POA: Diagnosis not present

## 2014-10-19 DIAGNOSIS — M1711 Unilateral primary osteoarthritis, right knee: Secondary | ICD-10-CM | POA: Diagnosis not present

## 2014-11-22 DIAGNOSIS — E785 Hyperlipidemia, unspecified: Secondary | ICD-10-CM | POA: Diagnosis not present

## 2014-11-22 DIAGNOSIS — N400 Enlarged prostate without lower urinary tract symptoms: Secondary | ICD-10-CM | POA: Diagnosis not present

## 2014-11-22 DIAGNOSIS — I10 Essential (primary) hypertension: Secondary | ICD-10-CM | POA: Diagnosis not present

## 2014-11-22 DIAGNOSIS — Z79899 Other long term (current) drug therapy: Secondary | ICD-10-CM | POA: Diagnosis not present

## 2014-11-23 ENCOUNTER — Encounter: Payer: Self-pay | Admitting: Orthopedic Surgery

## 2014-11-29 ENCOUNTER — Ambulatory Visit (INDEPENDENT_AMBULATORY_CARE_PROVIDER_SITE_OTHER): Payer: Medicare Other | Admitting: Orthopedic Surgery

## 2014-11-29 ENCOUNTER — Encounter: Payer: Self-pay | Admitting: Orthopedic Surgery

## 2014-11-29 ENCOUNTER — Ambulatory Visit (INDEPENDENT_AMBULATORY_CARE_PROVIDER_SITE_OTHER): Payer: Medicare Other

## 2014-11-29 VITALS — BP 129/71 | Ht 71.0 in | Wt 179.2 lb

## 2014-11-29 DIAGNOSIS — M1710 Unilateral primary osteoarthritis, unspecified knee: Secondary | ICD-10-CM

## 2014-11-29 DIAGNOSIS — M129 Arthropathy, unspecified: Secondary | ICD-10-CM

## 2014-11-29 NOTE — Progress Notes (Signed)
Patient ID: John Huffman, male   DOB: 04-May-1928, 81 y.o.   MRN: [id number]  Chief Complaint  Patient presents with  . Knee Pain    right knee pain, no known injury    HPI John Huffman is a 81 y.o. male.  Presents for evaluation and consideration of right total knee arthroplasty. The patient has received excellent care from [facility] and was scheduled for surgery in October but wishes to have the surgery sooner. Reviewing the notes it appears that these had excellent and appropriate care to include analgesia 6, appropriate rest, appropriate anti-inflammatory is when needed, injection.  The patient says that he has lost ability to function in a normal fashion. He's given up his golf game. He only walks when he has to otherwise he has too much pain. He complains of swelling and stiffness over the medial joint line and somewhat diffusely about the knee with 7 out of 10 pain.  System review negative except for some excessive tearing  Review of Systems Review of Systems 1. As above   Past Medical History  Diagnosis Date  . Hypertension   . High cholesterol   . Cancer     Proatate    Past Surgical History  Procedure Laterality Date  . Back surgery    . Appendectomy    . Vasectomy      Social History Social History  Substance Use Topics  . Smoking status: Never Smoker   . Smokeless tobacco: Never Used  . Alcohol Use: No    No Known Allergies  Current Outpatient Prescriptions  Medication Sig Dispense Refill  . amLODipine (NORVASC) 5 MG tablet Take 5 mg by mouth daily.    Marland Kitchen aspirin 81 MG tablet Take 81 mg by mouth daily.    Marland Kitchen donepezil (ARICEPT) 5 MG tablet Take 5 mg by mouth daily.    Marland Kitchen losartan (COZAAR) 100 MG tablet Take 100 mg by mouth daily.    . pantoprazole (PROTONIX) 40 MG tablet Take 1 tablet (40 mg total) by mouth daily. 60 tablet 3  . simvastatin (ZOCOR) 20 MG tablet Take 20 mg by mouth every evening.    . Tamsulosin HCl (FLOMAX) 0.4 MG CAPS Take  by mouth daily.      No current facility-administered medications for this visit.      Physical Exam Physical Exam Blood pressure 129/71, height 5\' 11"  (1.803 m), weight 179 lb 3.2 oz (81.285 kg). Body mass index is 25 kg/(m^2).   Gen. appearance normal appearance excellent body habitus in terms of BMI The patient is alert and oriented person place and time Mood is normal affect is normal Ambulatory status is ambulatory without assistive device but he is struggling  Exam of the upper extremities revealed no abnormalities, he has normal range of motion stability strength alignment and motor exam  Inspection left knee looks good alignment is good full range of motion, all ligaments were stable. Motor exam is normal. Neurovascular exam was intact  Inspection of the right knee shows tenderness over the medial compartment ROM flexion is 115 Stability collateral ligaments and cruciate ligaments were stable Strength strength in the extensor mechanism muscle tone were normal, neurovascular exam was intact  Skin: Normal 4 extremities  Pulses: 2+ in the dorsalis pedis pulses of both legs and radial arteries of both arms  Neuro: 4 extremities normal     Data Reviewed Imaging AP and lateral x-rays of the knees including AP of the left knee weightbearing shows severe  arthritis of the medial compartment  Patellofemoral x-rays show arthritis there I had to do an the patella x-ray if one was not included in his previous films. He does show some wear of the lateral patellofemoral articulation and perhaps some translation of the patella but thickness is normal    Assessment    Osteoarthritis primary right knee. Patient has had adequate nonoperative treatment and wishes to proceed with right total knee replacement  As I discussed with him there are certain consultations and risks of total knee procedure include but are not limited to bleeding infection stiffness pulmonary embolism and  blood clots. We discussed the models and the patient information packets and pictures were given. The patient had no further questions and he has ready for his replacement.    Plan    Right total knee arthroplasty       Arther Abbott 11/29/2014, 10:17 AM

## 2014-11-30 DIAGNOSIS — I10 Essential (primary) hypertension: Secondary | ICD-10-CM | POA: Diagnosis not present

## 2014-11-30 DIAGNOSIS — M1990 Unspecified osteoarthritis, unspecified site: Secondary | ICD-10-CM | POA: Diagnosis not present

## 2014-11-30 DIAGNOSIS — Z6825 Body mass index (BMI) 25.0-25.9, adult: Secondary | ICD-10-CM | POA: Diagnosis not present

## 2014-11-30 DIAGNOSIS — I350 Nonrheumatic aortic (valve) stenosis: Secondary | ICD-10-CM | POA: Diagnosis not present

## 2014-12-01 ENCOUNTER — Other Ambulatory Visit: Payer: Self-pay | Admitting: *Deleted

## 2014-12-06 ENCOUNTER — Telehealth: Payer: Self-pay | Admitting: Orthopedic Surgery

## 2014-12-06 NOTE — Telephone Encounter (Signed)
Regarding in-patient/admit surgery scheduled 12/20/14 at [facility], [place], ICD-10 M12.9, no pre-authorization required per Medicare (primary insurer) guidelines.  Per secondary insurer [employer] supplemental - no pre-authorization required as this plan follows Medicare guidelines, per Tressie Ellis L.

## 2014-12-14 NOTE — Patient Instructions (Signed)
John Huffman  12/14/2014     @PREFPERIOPPHARMACY @   Your procedure is scheduled on  12/20/2014   Report to [facility] at  615  A.M.  Call this number if you have problems the morning of surgery:  [phone]   Remember:  Do not eat food or drink liquids after midnight.  Take these medicines the morning of surgery with A SIP OF WATER  Norvasc, losartan, flomax.   Do not wear jewelry, make-up or nail polish.  Do not wear lotions, powders, or perfumes.  You may wear deodorant.  Do not shave 48 hours prior to surgery.  Men may shave face and neck.  Do not bring valuables to the hospital.  [facility] is not responsible for any belongings or valuables.  Contacts, dentures or bridgework may not be worn into surgery.  Leave your suitcase in the car.  After surgery it may be brought to your room.  For patients admitted to the hospital, discharge time will be determined by your treatment team.  Patients discharged the day of surgery will not be allowed to drive home.   Name and phone number of your driver:   family Special instructions:  none  Please read over the following fact sheets that you were given. Pain Booklet, Coughing and Deep Breathing, Blood Transfusion Information, Lab Information, MRSA Information, Surgical Site Infection Prevention, Anesthesia Post-op Instructions and Care and Recovery After Surgery      Total Knee Replacement Total knee replacement is a procedure to replace your knee joint with an artificial knee joint (prosthetic knee joint). The purpose of this surgery is to reduce pain and improve your knee function. LET [facility] CARE PROVIDER KNOW ABOUT:   Any allergies you have.  All medicines you are taking, including vitamins, herbs, eye drops, creams, and over-the-counter medicines.  Previous problems you or members of your family have had with the use of anesthetics.  Any blood disorders you have.  Previous surgeries you have  had.  Medical conditions you have. RISKS AND COMPLICATIONS  Generally, total knee replacement is a safe procedure. However, problems can occur and include:  Loss of range of motion of the knee or instability.  Loosening of the prosthesis.  Infection.  Persistent pain. BEFORE THE PROCEDURE   Do not eat or drink anything after midnight on the night before the procedure or as directed by your health care provider.  Ask your health care provider about changing or stopping your regular medicines. This is especially important if you are taking diabetes medicines or blood thinners. PROCEDURE   Just before the procedure, you will receive medicine that will make you drowsy (sedative). This will be given through a tube that is inserted into one of your veins (IV tube).  Then you will be given one of the following:  A medicine injected into your spine that numbs your body below the waist (spinal anesthetic).  A medicine that makes you fall asleep (general anesthetic).  You may also receive medicine to block feeling in your leg (nerve block) to help ease pain after surgery.  An incision will be made in your knee. Your surgeon will take out any damaged cartilage and bone by sawing off the damaged surfaces.  The surgeon will then put a new metal liner over the sawed-off portion of your thigh bone (femur) and a plastic liner over the sawed-off portion of one of the bones of your lower leg (tibia). This is  to restore alignment and function to your knee. A plastic piece is often used to restore the surface of your knee cap. AFTER THE PROCEDURE   You will be taken to the recovery area.  You may have drainage tubes to drain excess fluid from your knee. These tubes attach to a device that removes these fluids.  Once you are awake, stable, and taking fluids well, you will be taken to your hospital room.  You will receive physical therapy as prescribed by your health care provider.  Your surgeon  may recommend that you spend time (usually an additional 10-14 days) in an extended-care facility to help you begin walking again and improve your range of motion before you go home.  You may also be prescribed blood-thinning medicine to decrease your risk of developing blood clots in your leg. Document Released: 07/14/2000 Document Revised: 08/22/2013 Document Reviewed: 05/18/2011 [facility] Patient Information 2015 [place], [place]. This information is not intended to replace advice given to you by your health care provider. Make sure you discuss any questions you have with your health care provider. PATIENT INSTRUCTIONS POST-ANESTHESIA  IMMEDIATELY FOLLOWING SURGERY:  Do not drive or operate machinery for the first twenty four hours after surgery.  Do not make any important decisions for twenty four hours after surgery or while taking narcotic pain medications or sedatives.  If you develop intractable nausea and vomiting or a severe headache please notify your doctor immediately.  FOLLOW-UP:  Please make an appointment with your surgeon as instructed. You do not need to follow up with anesthesia unless specifically instructed to do so.  WOUND CARE INSTRUCTIONS (if applicable):  Keep a dry clean dressing on the anesthesia/puncture wound site if there is drainage.  Once the wound has quit draining you may leave it open to air.  Generally you should leave the bandage intact for twenty four hours unless there is drainage.  If the epidural site drains for more than 36-48 hours please call the anesthesia department.  QUESTIONS?:  Please feel free to call your physician or the hospital operator if you have any questions, and they will be happy to assist you.

## 2014-12-15 ENCOUNTER — Encounter (HOSPITAL_COMMUNITY)
Admission: RE | Admit: 2014-12-15 | Discharge: 2014-12-15 | Disposition: A | Discharge status: Home / Self Care | Payer: Medicare Other | Source: Ambulatory Visit | Attending: Orthopedic Surgery | Admitting: Orthopedic Surgery

## 2014-12-15 ENCOUNTER — Other Ambulatory Visit: Payer: Self-pay

## 2014-12-15 ENCOUNTER — Encounter (HOSPITAL_COMMUNITY): Payer: Self-pay

## 2014-12-15 DIAGNOSIS — Z01818 Encounter for other preprocedural examination: Secondary | ICD-10-CM | POA: Insufficient documentation

## 2014-12-15 DIAGNOSIS — M179 Osteoarthritis of knee, unspecified: Secondary | ICD-10-CM | POA: Diagnosis not present

## 2014-12-15 LAB — SURGICAL PCR SCREEN
MRSA, PCR: NEGATIVE
STAPHYLOCOCCUS AUREUS: NEGATIVE

## 2014-12-15 LAB — CBC WITH DIFFERENTIAL/PLATELET
BASOS PCT: 0 % (ref 0–1)
Basophils Absolute: 0 10*3/uL (ref 0.0–0.1)
Eosinophils Absolute: 0.1 10*3/uL (ref 0.0–0.7)
Eosinophils Relative: 1 % (ref 0–5)
HEMATOCRIT: 45 % (ref 39.0–52.0)
Hemoglobin: 15.5 g/dL (ref 13.0–17.0)
Lymphocytes Relative: 19 % (ref 12–46)
Lymphs Abs: 1.5 10*3/uL (ref 0.7–4.0)
MCH: 31.1 pg (ref 26.0–34.0)
MCHC: 34.4 g/dL (ref 30.0–36.0)
MCV: 90.2 fL (ref 78.0–100.0)
MONO ABS: 0.7 10*3/uL (ref 0.1–1.0)
MONOS PCT: 9 % (ref 3–12)
NEUTROS ABS: 5.4 10*3/uL (ref 1.7–7.7)
Neutrophils Relative %: 71 % (ref 43–77)
Platelets: 205 10*3/uL (ref 150–400)
RBC: 4.99 MIL/uL (ref 4.22–5.81)
RDW: 13.1 % (ref 11.5–15.5)
WBC: 7.6 10*3/uL (ref 4.0–10.5)

## 2014-12-15 LAB — BASIC METABOLIC PANEL
ANION GAP: 5 (ref 5–15)
BUN: 18 mg/dL (ref 6–20)
CALCIUM: 8.7 mg/dL — AB (ref 8.9–10.3)
CO2: 29 mmol/L (ref 22–32)
CREATININE: 0.87 mg/dL (ref 0.61–1.24)
Chloride: 105 mmol/L (ref 101–111)
GFR calc Af Amer: >60 mL/min (ref >60)
GFR calc non Af Amer: >60 mL/min (ref >60)
GLUCOSE: 118 mg/dL — AB (ref 65–99)
Potassium: 4.6 mmol/L (ref 3.5–5.1)
Sodium: 139 mmol/L (ref 135–145)

## 2014-12-15 LAB — PROTIME-INR
INR: 1.01 (ref 0.00–1.49)
Prothrombin Time: 13.5 seconds (ref 11.6–15.2)

## 2014-12-15 LAB — APTT: aPTT: 31 seconds (ref 24–37)

## 2014-12-16 LAB — ABO/RH: ABO/RH(D): A POS

## 2014-12-19 MED ORDER — TRANEXAMIC ACID 1000 MG/10ML IV SOLN
1000.0000 mg | INTRAVENOUS | Status: AC
  Administered 2014-12-20: 1000 mg via INTRAVENOUS
  Filled 2014-12-19: qty 10

## 2014-12-19 NOTE — H&P (Signed)
Progress Notes Info     Chief Strategy Officer Note Status Last Update User Last Update Date/Time    Carole Civil, MD Signed Carole Civil, MD 11/29/2014 10:21 AM     Progress Notes     Expand All Collapse All   Patient ID: John Huffman, male   DOB: 1928/08/04, 81 y.o.   MRN: [id number]    Chief Complaint   Patient presents with   .  Knee Pain       right knee pain, no known injury     HPI John Huffman is a 81 y.o. male.  Presents for evaluation and consideration of right total knee arthroplasty. The patient has received excellent care from [facility] and was scheduled for surgery in October but wishes to have the surgery sooner. Reviewing the notes it appears that these had excellent and appropriate care to include analgesia 6, appropriate rest, appropriate anti-inflammatory is when needed, injection.  The patient says that he has lost ability to function in a normal fashion. He's given up his golf game. He only walks when he has to otherwise he has too much pain. He complains of swelling and stiffness over the medial joint line and somewhat diffusely about the knee with 7 out of 10 pain.  System review negative except for some excessive tearing  Review of Systems Review of Systems 1. As above     Past Medical History   Diagnosis  Date   .  Hypertension     .  High cholesterol     .  Cancer         Proatate       Past Surgical History   Procedure  Laterality  Date   .  Back surgery       .  Appendectomy       .  Vasectomy         Social History Social History   Substance Use Topics   .  Smoking status:  Never Smoker    .  Smokeless tobacco:  Never Used   .  Alcohol Use:  No     No Known Allergies    Current Outpatient Prescriptions   Medication  Sig  Dispense  Refill   .  amLODipine (NORVASC) 5 MG tablet  Take 5 mg by mouth daily.       Marland Kitchen  aspirin 81 MG tablet  Take 81 mg by mouth daily.       Marland Kitchen  donepezil (ARICEPT) 5 MG tablet  Take 5 mg by mouth  daily.       Marland Kitchen  losartan (COZAAR) 100 MG tablet  Take 100 mg by mouth daily.       .  pantoprazole (PROTONIX) 40 MG tablet  Take 1 tablet (40 mg total) by mouth daily.  60 tablet  3   .  simvastatin (ZOCOR) 20 MG tablet  Take 20 mg by mouth every evening.       .  Tamsulosin HCl (FLOMAX) 0.4 MG CAPS  Take by mouth daily.           No current facility-administered medications for this visit.       Physical Exam Physical Exam Blood pressure 129/71, height 5\' 11"  (1.803 m), weight 179 lb 3.2 oz (81.285 kg). Body mass index is 25 kg/(m^2).   Gen. appearance normal appearance excellent body habitus in terms of BMI The patient is alert and oriented person place and time Mood is normal affect  is normal Ambulatory status is ambulatory without assistive device but he is struggling  Exam of the upper extremities revealed no abnormalities, he has normal range of motion stability strength alignment and motor exam  Inspection left knee looks good alignment is good full range of motion, all ligaments were stable. Motor exam is normal. Neurovascular exam was intact  Inspection of the right knee shows tenderness over the medial compartment ROM flexion is 115 Stability collateral ligaments and cruciate ligaments were stable Strength strength in the extensor mechanism muscle tone were normal, neurovascular exam was intact  Skin: Normal 4 extremities  Pulses: 2+ in the dorsalis pedis pulses of both legs and radial arteries of both arms  Neuro: 4 extremities normal     Data Reviewed Imaging AP and lateral x-rays of the knees including AP of the left knee weightbearing shows severe arthritis of the medial compartment  Patellofemoral x-rays show arthritis there I had to do an the patella x-ray if one was not included in his previous films. He does show some wear of the lateral patellofemoral articulation and perhaps some translation of the patella but thickness is normal    Assessment     Osteoarthritis primary right knee. Patient has had adequate nonoperative treatment and wishes to proceed with right total knee replacement  As I discussed with him there are certain consultations and risks of total knee procedure include but are not limited to bleeding infection stiffness pulmonary embolism and blood clots. We discussed the models and the patient information packets and pictures were given. The patient had no further questions and he has ready for his replacement.    Plan    Right total knee arthroplasty       Arther Abbott

## 2014-12-20 ENCOUNTER — Inpatient Hospital Stay (HOSPITAL_COMMUNITY): Payer: Medicare Other | Admitting: Anesthesiology

## 2014-12-20 ENCOUNTER — Inpatient Hospital Stay (HOSPITAL_COMMUNITY): Payer: Medicare Other

## 2014-12-20 ENCOUNTER — Encounter (HOSPITAL_COMMUNITY)
Admission: RE | Disposition: A | Discharge status: Home Health | Payer: Self-pay | Source: Ambulatory Visit | Attending: Orthopedic Surgery

## 2014-12-20 ENCOUNTER — Encounter (HOSPITAL_COMMUNITY): Payer: Self-pay | Admitting: *Deleted

## 2014-12-20 ENCOUNTER — Inpatient Hospital Stay (HOSPITAL_COMMUNITY)
Admission: RE | Admit: 2014-12-20 | Discharge: 2014-12-23 | DRG: 470 | Disposition: A | Discharge status: Home Health | Payer: Medicare Other | Source: Ambulatory Visit | Attending: Orthopedic Surgery | Admitting: Orthopedic Surgery

## 2014-12-20 DIAGNOSIS — E780 Pure hypercholesterolemia: Secondary | ICD-10-CM | POA: Diagnosis present

## 2014-12-20 DIAGNOSIS — Z8546 Personal history of malignant neoplasm of prostate: Secondary | ICD-10-CM | POA: Diagnosis not present

## 2014-12-20 DIAGNOSIS — M1710 Unilateral primary osteoarthritis, unspecified knee: Secondary | ICD-10-CM | POA: Insufficient documentation

## 2014-12-20 DIAGNOSIS — M1711 Unilateral primary osteoarthritis, right knee: Principal | ICD-10-CM | POA: Diagnosis present

## 2014-12-20 DIAGNOSIS — I10 Essential (primary) hypertension: Secondary | ICD-10-CM | POA: Diagnosis present

## 2014-12-20 DIAGNOSIS — M179 Osteoarthritis of knee, unspecified: Secondary | ICD-10-CM | POA: Diagnosis not present

## 2014-12-20 DIAGNOSIS — Z7982 Long term (current) use of aspirin: Secondary | ICD-10-CM | POA: Diagnosis not present

## 2014-12-20 DIAGNOSIS — M25561 Pain in right knee: Secondary | ICD-10-CM | POA: Diagnosis not present

## 2014-12-20 DIAGNOSIS — Z96651 Presence of right artificial knee joint: Secondary | ICD-10-CM | POA: Diagnosis not present

## 2014-12-20 DIAGNOSIS — Z471 Aftercare following joint replacement surgery: Secondary | ICD-10-CM | POA: Diagnosis not present

## 2014-12-20 HISTORY — PX: TOTAL KNEE ARTHROPLASTY: SHX125

## 2014-12-20 LAB — PREPARE RBC (CROSSMATCH)

## 2014-12-20 SURGERY — ARTHROPLASTY, KNEE, TOTAL
Anesthesia: Spinal | Site: Knee | Laterality: Right

## 2014-12-20 MED ORDER — PHENOL 1.4 % MT LIQD: 1.0000 | OROMUCOSAL | Status: DC | PRN

## 2014-12-20 MED ORDER — HYDROCODONE-ACETAMINOPHEN 5-325 MG PO TABS
1.0000 | ORAL_TABLET | Freq: Once | ORAL | Status: AC
  Administered 2014-12-20: 1 via ORAL
  Filled 2014-12-20: qty 1

## 2014-12-20 MED ORDER — ONDANSETRON HCL 4 MG PO TABS: 4.0000 mg | ORAL_TABLET | Freq: Four times a day (QID) | ORAL | Status: DC | PRN

## 2014-12-20 MED ORDER — BUPIVACAINE-EPINEPHRINE (PF) 0.5% -1:200000 IJ SOLN
INTRAMUSCULAR | Status: DC | PRN
  Administered 2014-12-20: 30 mL via PERINEURAL

## 2014-12-20 MED ORDER — MAGNESIUM CITRATE PO SOLN: 1.0000 | Freq: Once | ORAL | Status: DC | PRN

## 2014-12-20 MED ORDER — CHLORHEXIDINE GLUCONATE 4 % EX LIQD: 60.0000 mL | Freq: Once | CUTANEOUS | Status: DC

## 2014-12-20 MED ORDER — FENTANYL CITRATE (PF) 100 MCG/2ML IJ SOLN
INTRAMUSCULAR | Status: AC
  Filled 2014-12-20: qty 4

## 2014-12-20 MED ORDER — PROPOFOL 10 MG/ML IV BOLUS
INTRAVENOUS | Status: AC
  Filled 2014-12-20: qty 20

## 2014-12-20 MED ORDER — CEFAZOLIN SODIUM-DEXTROSE 2-3 GM-% IV SOLR
2.0000 g | INTRAVENOUS | Status: AC
  Administered 2014-12-20: 2 g via INTRAVENOUS

## 2014-12-20 MED ORDER — DEXTROSE 5 % IV SOLN
500.0000 mg | Freq: Four times a day (QID) | INTRAVENOUS | Status: DC | PRN
  Filled 2014-12-20: qty 5

## 2014-12-20 MED ORDER — FENTANYL CITRATE (PF) 100 MCG/2ML IJ SOLN
25.0000 ug | INTRAMUSCULAR | Status: AC
  Administered 2014-12-20 (×2): 25 ug via INTRAVENOUS
  Filled 2014-12-20: qty 2

## 2014-12-20 MED ORDER — DIPHENHYDRAMINE HCL 12.5 MG/5ML PO ELIX: 12.5000 mg | ORAL_SOLUTION | ORAL | Status: DC | PRN

## 2014-12-20 MED ORDER — SODIUM CHLORIDE 0.9 % IV SOLN
INTRAVENOUS | Status: DC | PRN
  Administered 2014-12-20: 60 mL

## 2014-12-20 MED ORDER — SODIUM CHLORIDE 0.9 % IV SOLN
INTRAVENOUS | Status: DC
  Administered 2014-12-20 – 2014-12-21 (×2): via INTRAVENOUS

## 2014-12-20 MED ORDER — CEFAZOLIN SODIUM 1-5 GM-% IV SOLN
1.0000 g | Freq: Four times a day (QID) | INTRAVENOUS | Status: AC
  Administered 2014-12-20 (×2): 1 g via INTRAVENOUS
  Filled 2014-12-20 (×2): qty 50

## 2014-12-20 MED ORDER — SODIUM CHLORIDE 0.9 % IR SOLN
Status: DC | PRN
  Administered 2014-12-20: 3000 mL

## 2014-12-20 MED ORDER — SODIUM CHLORIDE 0.9 % IJ SOLN
INTRAMUSCULAR | Status: AC
  Filled 2014-12-20: qty 10

## 2014-12-20 MED ORDER — METOCLOPRAMIDE HCL 5 MG/ML IJ SOLN: 5.0000 mg | Freq: Three times a day (TID) | INTRAMUSCULAR | Status: DC | PRN

## 2014-12-20 MED ORDER — FENTANYL CITRATE (PF) 250 MCG/5ML IJ SOLN
INTRAMUSCULAR | Status: DC | PRN
  Administered 2014-12-20: 25 ug via INTRAVENOUS
  Administered 2014-12-20: 25 ug via INTRATHECAL
  Administered 2014-12-20 (×2): 25 ug via INTRAVENOUS

## 2014-12-20 MED ORDER — DOCUSATE SODIUM 100 MG PO CAPS
100.0000 mg | ORAL_CAPSULE | Freq: Two times a day (BID) | ORAL | Status: DC
  Administered 2014-12-20 – 2014-12-23 (×7): 100 mg via ORAL
  Filled 2014-12-20 (×7): qty 1

## 2014-12-20 MED ORDER — POLYETHYLENE GLYCOL 3350 17 G PO PACK
17.0000 g | PACK | Freq: Every day | ORAL | Status: DC
  Administered 2014-12-20 – 2014-12-23 (×4): 17 g via ORAL
  Filled 2014-12-20 (×4): qty 1

## 2014-12-20 MED ORDER — FENTANYL CITRATE (PF) 100 MCG/2ML IJ SOLN: 25.0000 ug | INTRAMUSCULAR | Status: DC | PRN

## 2014-12-20 MED ORDER — ALUM & MAG HYDROXIDE-SIMETH 200-200-20 MG/5ML PO SUSP: 30.0000 mL | ORAL | Status: DC | PRN

## 2014-12-20 MED ORDER — ONDANSETRON HCL 4 MG/2ML IJ SOLN
4.0000 mg | Freq: Once | INTRAMUSCULAR | Status: AC | PRN
  Administered 2014-12-20: 4 mg via INTRAVENOUS

## 2014-12-20 MED ORDER — BUPIVACAINE IN DEXTROSE 0.75-8.25 % IT SOLN
INTRATHECAL | Status: DC | PRN
  Administered 2014-12-20: 15 mg via INTRATHECAL

## 2014-12-20 MED ORDER — SODIUM CHLORIDE 0.9 % IJ SOLN
INTRAMUSCULAR | Status: AC
  Filled 2014-12-20: qty 40

## 2014-12-20 MED ORDER — SIMVASTATIN 20 MG PO TABS
40.0000 mg | ORAL_TABLET | Freq: Every day | ORAL | Status: DC
  Administered 2014-12-20 – 2014-12-22 (×3): 40 mg via ORAL
  Filled 2014-12-20 (×3): qty 2

## 2014-12-20 MED ORDER — AMLODIPINE BESYLATE 5 MG PO TABS
5.0000 mg | ORAL_TABLET | Freq: Every day | ORAL | Status: DC
Start: 2014-12-20 — End: 2014-12-23
  Administered 2014-12-20 – 2014-12-23 (×4): 5 mg via ORAL
  Filled 2014-12-20 (×4): qty 1

## 2014-12-20 MED ORDER — ONDANSETRON HCL 4 MG/2ML IJ SOLN: 4.0000 mg | Freq: Four times a day (QID) | INTRAMUSCULAR | Status: DC | PRN

## 2014-12-20 MED ORDER — EPHEDRINE SULFATE 50 MG/ML IJ SOLN
INTRAMUSCULAR | Status: DC | PRN
  Administered 2014-12-20 (×2): 5 mg via INTRAVENOUS

## 2014-12-20 MED ORDER — MIDAZOLAM HCL 2 MG/2ML IJ SOLN
1.0000 mg | INTRAMUSCULAR | Status: DC | PRN
  Administered 2014-12-20: 2 mg via INTRAVENOUS
  Filled 2014-12-20: qty 2

## 2014-12-20 MED ORDER — MENTHOL 3 MG MT LOZG: 1.0000 | LOZENGE | OROMUCOSAL | Status: DC | PRN

## 2014-12-20 MED ORDER — TAMSULOSIN HCL 0.4 MG PO CAPS
0.4000 mg | ORAL_CAPSULE | Freq: Every day | ORAL | Status: DC
  Administered 2014-12-20 – 2014-12-23 (×4): 0.4 mg via ORAL
  Filled 2014-12-20 (×4): qty 1

## 2014-12-20 MED ORDER — PROPOFOL INFUSION 10 MG/ML OPTIME
INTRAVENOUS | Status: DC | PRN
  Administered 2014-12-20: 35 ug/kg/min via INTRAVENOUS
  Administered 2014-12-20: 09:00:00 via INTRAVENOUS

## 2014-12-20 MED ORDER — ASPIRIN EC 325 MG PO TBEC
325.0000 mg | DELAYED_RELEASE_TABLET | Freq: Two times a day (BID) | ORAL | Status: DC
  Administered 2014-12-20 – 2014-12-23 (×7): 325 mg via ORAL
  Filled 2014-12-20 (×7): qty 1

## 2014-12-20 MED ORDER — ONDANSETRON HCL 4 MG/2ML IJ SOLN
4.0000 mg | Freq: Four times a day (QID) | INTRAMUSCULAR | Status: DC
  Administered 2014-12-20 – 2014-12-21 (×3): 4 mg via INTRAVENOUS
  Filled 2014-12-20 (×4): qty 2

## 2014-12-20 MED ORDER — LIDOCAINE HCL (PF) 1 % IJ SOLN
INTRAMUSCULAR | Status: AC
  Filled 2014-12-20: qty 5

## 2014-12-20 MED ORDER — DEXAMETHASONE SODIUM PHOSPHATE 4 MG/ML IJ SOLN
10.0000 mg | Freq: Once | INTRAMUSCULAR | Status: AC
  Administered 2014-12-21: 10 mg via INTRAVENOUS
  Filled 2014-12-20: qty 3

## 2014-12-20 MED ORDER — CEFAZOLIN SODIUM-DEXTROSE 2-3 GM-% IV SOLR
INTRAVENOUS | Status: AC
  Filled 2014-12-20: qty 50

## 2014-12-20 MED ORDER — MIDAZOLAM HCL 2 MG/2ML IJ SOLN
INTRAMUSCULAR | Status: AC
  Filled 2014-12-20: qty 4

## 2014-12-20 MED ORDER — LOSARTAN POTASSIUM 50 MG PO TABS
100.0000 mg | ORAL_TABLET | Freq: Every day | ORAL | Status: DC
  Administered 2014-12-21 – 2014-12-23 (×3): 100 mg via ORAL
  Filled 2014-12-20 (×3): qty 2

## 2014-12-20 MED ORDER — MIDAZOLAM HCL 5 MG/5ML IJ SOLN
INTRAMUSCULAR | Status: DC | PRN
  Administered 2014-12-20 (×2): 1 mg via INTRAVENOUS

## 2014-12-20 MED ORDER — EPHEDRINE SULFATE 50 MG/ML IJ SOLN
INTRAMUSCULAR | Status: AC
  Filled 2014-12-20: qty 1

## 2014-12-20 MED ORDER — LACTATED RINGERS IV SOLN
INTRAVENOUS | Status: DC
  Administered 2014-12-20: 09:00:00 via INTRAVENOUS
  Administered 2014-12-20: 1000 mL via INTRAVENOUS

## 2014-12-20 MED ORDER — BUPIVACAINE LIPOSOME 1.3 % IJ SUSP
INTRAMUSCULAR | Status: AC
  Filled 2014-12-20: qty 20

## 2014-12-20 MED ORDER — CELECOXIB 100 MG PO CAPS
200.0000 mg | ORAL_CAPSULE | Freq: Two times a day (BID) | ORAL | Status: DC
  Administered 2014-12-20 – 2014-12-23 (×6): 200 mg via ORAL
  Filled 2014-12-20 (×6): qty 2

## 2014-12-20 MED ORDER — BUPIVACAINE LIPOSOME 1.3 % IJ SUSP
20.0000 mL | Freq: Once | INTRAMUSCULAR | Status: DC
  Filled 2014-12-20: qty 20

## 2014-12-20 MED ORDER — HYDROMORPHONE HCL 1 MG/ML IJ SOLN
0.5000 mg | INTRAMUSCULAR | Status: DC | PRN
  Administered 2014-12-20 – 2014-12-21 (×5): 0.5 mg via INTRAVENOUS
  Filled 2014-12-20 (×6): qty 1

## 2014-12-20 MED ORDER — SODIUM CHLORIDE 0.9 % IR SOLN
Status: DC | PRN
  Administered 2014-12-20: 1000 mL

## 2014-12-20 MED ORDER — METHOCARBAMOL 500 MG PO TABS: 500.0000 mg | ORAL_TABLET | Freq: Four times a day (QID) | ORAL | Status: DC | PRN

## 2014-12-20 MED ORDER — PROPOFOL 10 MG/ML IV BOLUS
INTRAVENOUS | Status: DC | PRN
  Administered 2014-12-20 (×4): 10 mg via INTRAVENOUS

## 2014-12-20 MED ORDER — HYDROCODONE-ACETAMINOPHEN 7.5-325 MG PO TABS
1.0000 | ORAL_TABLET | ORAL | Status: DC | PRN
  Administered 2014-12-20 – 2014-12-21 (×5): 1 via ORAL
  Filled 2014-12-20 (×5): qty 1

## 2014-12-20 MED ORDER — CELECOXIB 100 MG PO CAPS
200.0000 mg | ORAL_CAPSULE | Freq: Every day | ORAL | Status: DC
  Administered 2014-12-20: 200 mg via ORAL
  Filled 2014-12-20: qty 2

## 2014-12-20 MED ORDER — METHOCARBAMOL 1000 MG/10ML IJ SOLN
500.0000 mg | Freq: Four times a day (QID) | INTRAVENOUS | Status: DC
  Administered 2014-12-20: 500 mg via INTRAVENOUS
  Filled 2014-12-20 (×5): qty 5

## 2014-12-20 MED ORDER — BUPIVACAINE-EPINEPHRINE (PF) 0.5% -1:200000 IJ SOLN
INTRAMUSCULAR | Status: AC
Start: 2014-12-20 — End: 2014-12-20
  Filled 2014-12-20: qty 30

## 2014-12-20 MED ORDER — METOCLOPRAMIDE HCL 10 MG PO TABS: 5.0000 mg | ORAL_TABLET | Freq: Three times a day (TID) | ORAL | Status: DC | PRN

## 2014-12-20 MED ORDER — EPINEPHRINE HCL 0.1 MG/ML IJ SOSY
PREFILLED_SYRINGE | INTRAMUSCULAR | Status: DC | PRN
  Administered 2014-12-20: .1 mL via INTRAVENOUS

## 2014-12-20 SURGICAL SUPPLY — 73 items
BAG HAMPER (MISCELLANEOUS) ×3 IMPLANT
BANDAGE ESMARK 6X9 LF (GAUZE/BANDAGES/DRESSINGS) ×1 IMPLANT
BIT DRILL 3.2X128 (BIT) IMPLANT
BIT DRILL 3.2X128MM (BIT)
BLADE HEX COATED 2.75 (ELECTRODE) ×3 IMPLANT
BNDG CMPR 9X6 STRL LF SNTH (GAUZE/BANDAGES/DRESSINGS) ×1
BNDG ESMARK 6X9 LF (GAUZE/BANDAGES/DRESSINGS) ×3
BOWL SMART MIX CTS (DISPOSABLE) ×2 IMPLANT
CAP KNEE TOTAL 3 SIGMA ×2 IMPLANT
CEMENT HV SMART SET (Cement) ×6 IMPLANT
CLOTH BEACON ORANGE TIMEOUT ST (SAFETY) ×3 IMPLANT
COOLER CRYO CUFF IC AND MOTOR (MISCELLANEOUS) ×3 IMPLANT
COVER LIGHT HANDLE STERIS (MISCELLANEOUS) ×6 IMPLANT
COVER PROBE W GEL 5X96 (DRAPES) ×3 IMPLANT
CUFF CRYO KNEE LG 20X31 COOLER (ORTHOPEDIC SUPPLIES) IMPLANT
CUFF CRYO KNEE18X23 MED (MISCELLANEOUS) ×3 IMPLANT
CUFF TOURNIQUET SINGLE 34IN LL (TOURNIQUET CUFF) ×2 IMPLANT
CUFF TOURNIQUET SINGLE 44IN (TOURNIQUET CUFF) IMPLANT
DECANTER SPIKE VIAL GLASS SM (MISCELLANEOUS) ×4 IMPLANT
DRAPE BACK TABLE (DRAPES) ×3 IMPLANT
DRAPE EXTREMITY T 121X128X90 (DRAPE) ×3 IMPLANT
DRSG AQUACEL AG ADV 3.5X10 (GAUZE/BANDAGES/DRESSINGS) ×3 IMPLANT
DRSG MEPILEX BORDER 4X12 (GAUZE/BANDAGES/DRESSINGS) ×1 IMPLANT
DURAPREP 26ML APPLICATOR (WOUND CARE) ×6 IMPLANT
ELECT REM PT RETURN 9FT ADLT (ELECTROSURGICAL) ×3
ELECTRODE REM PT RTRN 9FT ADLT (ELECTROSURGICAL) ×1 IMPLANT
EVACUATOR 3/16  PVC DRAIN (DRAIN) ×2
EVACUATOR 3/16 PVC DRAIN (DRAIN) ×1 IMPLANT
GLOVE BIOGEL M 7.0 STRL (GLOVE) ×6 IMPLANT
GLOVE BIOGEL PI IND STRL 7.0 (GLOVE) IMPLANT
GLOVE BIOGEL PI INDICATOR 7.0 (GLOVE) ×8
GLOVE ECLIPSE 7.0 STRL STRAW (GLOVE) ×2 IMPLANT
GLOVE EXAM NITRILE MD LF STRL (GLOVE) ×6 IMPLANT
GLOVE OPTIFIT SS 8.0 STRL (GLOVE) ×3 IMPLANT
GLOVE SKINSENSE NS SZ8.0 LF (GLOVE) ×4
GLOVE SKINSENSE STRL SZ8.0 LF (GLOVE) ×2 IMPLANT
GLOVE SS N UNI LF 8.5 STRL (GLOVE) ×3 IMPLANT
GOWN STRL REUS W/ TWL LRG LVL3 (GOWN DISPOSABLE) ×1 IMPLANT
GOWN STRL REUS W/TWL LRG LVL3 (GOWN DISPOSABLE) ×9 IMPLANT
GOWN STRL REUS W/TWL XL LVL3 (GOWN DISPOSABLE) ×3 IMPLANT
HANDPIECE INTERPULSE COAX TIP (DISPOSABLE) ×3
HOOD W/PEELAWAY (MISCELLANEOUS) ×12 IMPLANT
INST SET MAJOR BONE (KITS) ×3 IMPLANT
IV NS IRRIG 3000ML ARTHROMATIC (IV SOLUTION) ×3 IMPLANT
KIT BLADEGUARD II DBL (SET/KITS/TRAYS/PACK) ×3 IMPLANT
KIT ROOM TURNOVER APOR (KITS) ×3 IMPLANT
MANIFOLD NEPTUNE II (INSTRUMENTS) ×3 IMPLANT
MARKER SKIN DUAL TIP RULER LAB (MISCELLANEOUS) ×3 IMPLANT
NDL HYPO 21X1.5 SAFETY (NEEDLE) ×1 IMPLANT
NDL HYPO 25X1 1.5 SAFETY (NEEDLE) ×1 IMPLANT
NEEDLE HYPO 21X1.5 SAFETY (NEEDLE) IMPLANT
NEEDLE HYPO 25X1 1.5 SAFETY (NEEDLE) ×3 IMPLANT
NS IRRIG 1000ML POUR BTL (IV SOLUTION) ×3 IMPLANT
PACK TOTAL JOINT (CUSTOM PROCEDURE TRAY) ×3 IMPLANT
PAD ARMBOARD 7.5X6 YLW CONV (MISCELLANEOUS) ×3 IMPLANT
PAD DANNIFLEX CPM (ORTHOPEDIC SUPPLIES) ×3 IMPLANT
PIN TROCAR 3 INCH (PIN) ×3 IMPLANT
SAW OSC TIP CART 19.5X105X1.3 (SAW) ×3 IMPLANT
SET BASIN LINEN APH (SET/KITS/TRAYS/PACK) ×3 IMPLANT
SET HNDPC FAN SPRY TIP SCT (DISPOSABLE) ×1 IMPLANT
STAPLER VISISTAT 35W (STAPLE) ×3 IMPLANT
SUT BRALON NAB BRD #1 30IN (SUTURE) ×6 IMPLANT
SUT MON AB 0 CT1 (SUTURE) ×5 IMPLANT
SUT MON AB 2-0 CT1 36 (SUTURE) IMPLANT
SYR 20CC LL (SYRINGE) ×6 IMPLANT
SYR 30ML LL (SYRINGE) ×3 IMPLANT
SYR BULB IRRIGATION 50ML (SYRINGE) ×3 IMPLANT
TAPE CLOTH SURG 4X10 WHT LF (GAUZE/BANDAGES/DRESSINGS) ×2 IMPLANT
TOWEL OR 17X26 4PK STRL BLUE (TOWEL DISPOSABLE) ×3 IMPLANT
TOWER CARTRIDGE SMART MIX (DISPOSABLE) IMPLANT
TRAY FOLEY CATH SILVER 16FR (SET/KITS/TRAYS/PACK) ×3 IMPLANT
WATER STERILE IRR 1000ML POUR (IV SOLUTION) ×8 IMPLANT
YANKAUER SUCT 12FT TUBE ARGYLE (SUCTIONS) ×6 IMPLANT

## 2014-12-20 NOTE — Clinical Social Work Note (Signed)
CSW received consult for possible SNF. PT has evaluated pt and recommend home with home health. CSW will sign off, but can be reconsulted if needed.  Benay Pike, [place]

## 2014-12-20 NOTE — Op Note (Signed)
Surgical dictation for right total knee   Preop diagnosis osteoarthritis right knee Postop diagnosis osteoarthritis right knee Surgeon Dr. 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 Mild pannus formation.    details of procedure:   The patient was identified in the preop holding area and the surgical site was confirmed as the right knee. Chart review and update were completed. The patient was taken to the operating room for spinal anesthesia. After successful spinal anesthesia Foley catheter was inserted. The patient was placed supine on the operating table.  The right leg was prepped with DuraPrep and draped sterilely. Timeout was completed. The limb was then exsanguinated a  6 inch Esmarch. The tourniquet was elevated to 300 mmHg.   A midline incision was made and taken down to the extensor mechanism followed by medial arthrotomy. The patella was everted. Side effect to me was performed as needed. The osteophytes were resected.  Anterior cruciate ligament and PCL and medial and lateral meniscus were resected.   a 3/8 inch drill bit was used to enter the femoral canal which was suctioned and irrigated until the fluid was clear. The distal femoral cut was set for 11 millimeter resection with a 5   Left Valgus angle. This cut was completed and checked for flatness.   the femur was then measured to a size 5  The cutting block was placed to match the epicondyles and the 4 distal cuts were made.   the tibia was subluxated forward and the external alignment guide was placed. We removed 10 mm of bone from the  higher lateral side. We set the guide for neutral varus valgus cut related to the  Mechanical axis of the tibia and for slope matching the patient's anatomy. Rotational alignment was set using the tibial tubercle, tibial spine and second metatarsal. The cutting block was pinned and the proximal tibia was resected.    spacer blocks were placed starting with a 10 mm insert to confirm equal flexion-extension gaps. A size  12.5  mm insert balanced the gaps.   We placed the femoral notch cutting guide size 5  and resected the notch.   Trial implants replaced using appropriate size femur , appropriate size tibial baseplate which was measured after the proximal tibia resection. Tibial rotation was set patella tracking was normal   The tibia was then punched per manufacture technique making sure to avoid internal rotation.   The patella measured a size 22 mm thickness   We resected down to a size 14 using a size 38 x 9 button.   Final range of motion check was performed with the appropriate size trials as mentioned above. Satisfactory reduction and motion were obtained.   Trial implants were removed. The bone was irrigated and dried and the cement was mixed on the back table  These implants were then cemented in place. Excess cement was removed. The cement was allowed to cure. Second irrigation was performed.    FInal range of motion check and stability  check was completed  The wound was irrigated third time Hemovac drain was placed, extensor mechanism was closed with #1 Nurolon followed by 0 Monocryl and staples to reapproximate the skin edges and subcutaneous tissue.   Sterile dressing was applied  The patient was taken recovery in stable condition

## 2014-12-20 NOTE — Plan of Care (Signed)
Problem: Acute Rehab PT Goals(only PT should resolve) Goal: Pt Will Ambulate Pt will ambulate with LRAD at Supervision using a step-through pattern and equal step length for a distances greater than 220ft to demonstrate the ability to perform safe household distance ambulation at discharge.    Goal: Pt Will Go Up/Down Stairs Pt will ascend/descend 5 stairs with LRAD and 1 HR at Supervision to demonstrate safe entry/exit of home.

## 2014-12-20 NOTE — Anesthesia Preprocedure Evaluation (Signed)
Anesthesia Evaluation  Patient identified by MRN, date of birth, ID band  Reviewed: Allergy & Precautions, NPO status , Patient's Chart, lab work & pertinent test results  Airway Mallampati: I  TM Distance: >3 FB     Dental  (+) Teeth Intact   Pulmonary neg pulmonary ROS,  breath sounds clear to auscultation        Cardiovascular hypertension, Pt. on medications Rhythm:Regular Rate:Normal     Neuro/Psych  Headaches,    GI/Hepatic negative GI ROS,   Endo/Other    Renal/GU      Musculoskeletal  (+) Arthritis -,   Abdominal   Peds  Hematology   Anesthesia Other Findings Hx prostate cancer  Reproductive/Obstetrics                             Anesthesia Physical Anesthesia Plan  ASA: II  Anesthesia Plan: Spinal   Post-op Pain Management:    Induction:   Airway Management Planned: Simple Face Mask  Additional Equipment:   Intra-op Plan:   Post-operative Plan:   Informed Consent: I have reviewed the patients History and Physical, chart, labs and discussed the procedure including the risks, benefits and alternatives for the proposed anesthesia with the patient or authorized representative who has indicated his/her understanding and acceptance.     Plan Discussed with:   Anesthesia Plan Comments:         Anesthesia Quick Evaluation

## 2014-12-20 NOTE — Evaluation (Signed)
Physical Therapy Evaluation Patient Details Name: John Huffman MRN: [id number] DOB: 12-Sep-1928 Today's Date: 12/20/2014   History of Present Illness  Pt is an 64 yowhite male who reports a gradual decline in ambulation, and worsening of ambulatory pain in R knee over the previous 12 months. Pt presents today a few hours s/p R TKA.   Clinical Impression  Pt is received semirecumbent in bed upon entry, wife in room. Pt is awake, alert, and willing to participate. No acute distress noted. Pt is A&Ox3 and pleasant. Pt reports zero falls in the last 6 months, and an otherwise uncomplicated PMH with exception to CC. Pt strength as screened by functional mobility presents without impairment, but is generally weak in surgical limb. Pt is able to perform all bed mobility and transfers with minimal supervision, and requires only standy-by assistance to assure safety during gait trial. Pt received on 2L O2 at 95% upon entry, however after performing entire session on room air and saturating at 94%, RN confirmed it was ok to leave pt on room air at end of session. Patient presenting with impairment of strength, range of motion, balance, and activity tolerance, limiting ability to perform ADL and mobility tasks at  baseline level of function. Patient will benefit from skilled intervention to address the above impairments and limitations, in order to restore to prior level of function, improve patient safety upon discharge, and to decrease falls risk.       Follow Up Recommendations Home health PT    Equipment Recommendations  None recommended by PT    Recommendations for Other Services       Precautions / Restrictions Precautions Precautions: None Restrictions Weight Bearing Restrictions: Yes RLE Weight Bearing: Weight bearing as tolerated      Mobility  Bed Mobility Overal bed mobility: Modified Independent Bed Mobility: Supine to Sit;Sit to Supine     Supine to sit: Modified independent  (Device/Increase time) Sit to supine: Modified independent (Device/Increase time)   General bed mobility comments: Extra time required.   Transfers Overall transfer level: Needs assistance Equipment used: Rolling walker (2 wheeled) Transfers: Sit to/from Stand Sit to Stand: Supervision         General transfer comment: Demonstrates excellent strength, and safe use of RW with verbal cues.   Ambulation/Gait Ambulation/Gait assistance: Min guard Ambulation Distance (Feet): 40 Feet Assistive device: Rolling walker (2 wheeled) Gait Pattern/deviations: Decreased step length - right;Decreased step length - left   Gait velocity interpretation: <1.8 ft/sec, indicative of risk for recurrent falls General Gait Details: Pt reports to feel very unsteady, a little shakey. Pt taking small and cautious steps c good RLE mechanics, showing safe use of RW.   Stairs            Wheelchair Mobility    Modified [name] (Stroke Patients Only)       Balance Overall balance assessment: Modified Independent;No apparent balance deficits (not formally assessed)                                           Pertinent Vitals/Pain Pain Assessment: 0-10 Pain Score: 4  (No increase during session. ) Pain Location: R knee Pain Descriptors / Indicators: Aching Pain Intervention(s): Limited activity within patient's tolerance;Monitored during session;Premedicated before session;Relaxation;Ice applied    Home Living Family/patient expects to be discharged to:: Private residence Living Arrangements: Spouse/significant other Available Help at Discharge:  Family Type of Home: House Home Access: Stairs to enter Entrance Stairs-Rails: Can reach both Entrance Stairs-Number of Steps: 5 in front, 8 in back  Home Layout: Two level;Able to live on main level with bedroom/bathroom Home Equipment: Walker - 2 wheels      Prior Function                 Hand Dominance         Extremity/Trunk Assessment   Upper Extremity Assessment: Overall WFL for tasks assessed           Lower Extremity Assessment: Overall WFL for tasks assessed;RLE deficits/detail RLE Deficits / Details: Typical postoperative deficits following TKA; pt able to perform all therex with minimal physical assistance.     Cervical / Trunk Assessment: Normal  Communication      Cognition Arousal/Alertness: Awake/alert Behavior During Therapy: WFL for tasks assessed/performed Overall Cognitive Status: Within Functional Limits for tasks assessed                      General Comments      Exercises Total Joint Exercises Ankle Circles/Pumps: AROM;Both;15 reps;Supine Quad Sets: 10 reps;Right;AROM;Strengthening;Supine Short Arc Quad: AROM;Strengthening;Right;10 reps;Supine Heel Slides: AAROM;Strengthening;Right;10 reps;Supine Hip ABduction/ADduction: AROM;Strengthening;10 reps;Supine Straight Leg Raises: AROM;Strengthening;10 reps;Supine;Right Goniometric ROM: 12-92 degrees R knee flexion.       Assessment/Plan    PT Assessment Patient needs continued PT services  PT Diagnosis Difficulty walking;Abnormality of gait;Generalized weakness;Acute pain   PT Problem List Decreased strength;Decreased range of motion;Decreased activity tolerance;Decreased balance;Decreased mobility;Decreased coordination;Decreased cognition  PT Treatment Interventions DME instruction;Patient/family education;Stair training;Gait training;Functional mobility training;Therapeutic exercise;Therapeutic activities;Balance training   PT Goals (Current goals can be found in the Care Plan section) Acute Rehab PT Goals Patient Stated Goal: Return to home, continue with OP rehab to improve ambulatory mobility.  PT Goal Formulation: With patient Time For Goal Achievement: 01/03/15 Potential to Achieve Goals: Good    Frequency BID   Barriers to discharge   5-8 stairs to enter home.     Co-evaluation                End of Session Equipment Utilized During Treatment: Gait belt Activity Tolerance: Patient tolerated treatment well;No increased pain;Patient limited by fatigue Patient left: in bed;with call bell/phone within reach;with bed alarm set Nurse Communication: Mobility status;Other (comment) (DC of Supplemental O2. )         Time: 6759-1638 PT Time Calculation (min) (ACUTE ONLY): 39 min   Charges:   PT Evaluation $Initial PT Evaluation Tier I: 1 Procedure PT Treatments $Gait Training: 8-22 mins $Therapeutic Exercise: 8-22 mins   PT G Codes:        Buccola,Allan C 12-21-2014, 2:14 PM 2:18 PM  Etta Grandchild, PT, DPT [place] License # [id number]

## 2014-12-20 NOTE — Anesthesia Postprocedure Evaluation (Signed)
  Anesthesia Post-op Note  Patient: John Huffman  Procedure(s) Performed: Procedure(s): TOTAL KNEE ARTHROPLASTY (Right)  Patient Location: PACU  Anesthesia Type:Spinal  Level of Consciousness: awake, alert  and oriented  Airway and Oxygen Therapy: Patient Spontanous Breathing and Patient connected to nasal cannula oxygen  Post-op Pain: none  Post-op Assessment: Post-op Vital signs reviewed, Patient's Cardiovascular Status Stable, Respiratory Function Stable, Patent Airway and No signs of Nausea or vomiting LLE Motor Response: Non-purposeful movement LLE Sensation: Numbness RLE Motor Response: Non-purposeful movement RLE Sensation: Numbness L Sensory Level: T11 R Sensory Level: T11  Post-op Vital Signs: Reviewed and stable  Last Vitals:  Filed Vitals:   12/20/14 0955  BP: 114/59  Temp: 36.6 C  Resp: 16    Complications: No apparent anesthesia complications, Moving lower extremities, no sensation.

## 2014-12-20 NOTE — Interval H&P Note (Signed)
History and Physical Interval Note:  12/20/2014 7:31 AM  John Huffman  has presented today for surgery, with the diagnosis of OSTEOARTHRITIS RIGHT KNEE  The various methods of treatment have been discussed with the patient and family. After consideration of risks, benefits and other options for treatment, the patient has consented to  Procedure(s): TOTAL KNEE ARTHROPLASTY (Right) as a surgical intervention .  The patient's history has been reviewed, patient examined, no change in status, stable for surgery.  I have reviewed the patient's chart and labs.  Questions were answered to the patient's satisfaction.     Arther Abbott

## 2014-12-20 NOTE — Transfer of Care (Signed)
Immediate Anesthesia Transfer of Care Note  Patient: John Huffman  Procedure(s) Performed: Procedure(s): TOTAL KNEE ARTHROPLASTY (Right)  Patient Location: PACU  Anesthesia Type:Spinal  Level of Consciousness: awake, alert  and oriented  Airway & Oxygen Therapy: Patient Spontanous Breathing and Patient connected to nasal cannula oxygen  Post-op Assessment: Report given to RN  Post vital signs: Reviewed and stable  Last Vitals:  Filed Vitals:   12/20/14 0735  BP: 116/60  Temp:   Resp: 15    Complications: No apparent anesthesia complications

## 2014-12-20 NOTE — Brief Op Note (Signed)
12/20/2014  9:49 AM  PATIENT:  John Huffman  81 y.o. male  PRE-OPERATIVE DIAGNOSIS:  OSTEOARTHRITIS RIGHT KNEE  POST-OPERATIVE DIAGNOSIS:  OSTEOARTHRITIS RIGHT KNEE  PROCEDURE:  Procedure(s): TOTAL KNEE ARTHROPLASTY (Right)  SURGEON:  Surgeon(s) and Role:    * Carole Civil, MD - Primary  PHYSICIAN ASSISTANT:   ASSISTANTS: debbie dallas and betty ashley    ANESTHESIA:   spinal  EBL:  Total I/O In: 1400 [I.V.:1400] Out: 150 [Urine:100; Blood:50]  BLOOD ADMINISTERED:none  DRAINS: hemovac  LOCAL MEDICATIONS USED:  MARCAINE   + exparel  SPECIMEN:  No Specimen  DISPOSITION OF SPECIMEN:  N/A  COUNTS:  YES  TOURNIQUET:   Total Tourniquet Time Documented: Thigh (Right) - 81 minutes Total: Thigh (Right) - 81 minutes   DICTATION: .Viviann Spare Dictation  PLAN OF CARE: Admit to inpatient   PATIENT DISPOSITION:  PACU - hemodynamically stable.   Delay start of Pharmacological VTE agent (>24hrs) due to surgical blood loss or risk of bleeding: not applicable

## 2014-12-20 NOTE — Anesthesia Procedure Notes (Signed)
Spinal Patient location during procedure: OR Start time: 12/20/2014 7:53 AM Preanesthetic Checklist Completed: patient identified, site marked, surgical consent, pre-op evaluation, timeout performed, IV checked, risks and benefits discussed and monitors and equipment checked Spinal Block Patient position: right lateral decubitus Prep: Betadine Patient monitoring: heart rate, cardiac monitor, continuous pulse ox and blood pressure Approach: right paramedian Location: L3-4 Injection technique: single-shot Needle Needle type: Spinocan  Needle gauge: 22 G Needle length: 9 cm Assessment Sensory level: T8 Additional Notes ATTEMPTS:1 TRAY UY:[id number] TRAY EXPIRATION DATE:03/2016

## 2014-12-21 ENCOUNTER — Encounter (HOSPITAL_COMMUNITY): Payer: Self-pay | Admitting: Orthopedic Surgery

## 2014-12-21 LAB — BASIC METABOLIC PANEL
ANION GAP: 6 (ref 5–15)
BUN: 11 mg/dL (ref 6–20)
CALCIUM: 8.2 mg/dL — AB (ref 8.9–10.3)
CO2: 30 mmol/L (ref 22–32)
Chloride: 105 mmol/L (ref 101–111)
Creatinine, Ser: 0.72 mg/dL (ref 0.61–1.24)
Glucose, Bld: 115 mg/dL — ABNORMAL HIGH (ref 65–99)
Potassium: 4.7 mmol/L (ref 3.5–5.1)
SODIUM: 141 mmol/L (ref 135–145)

## 2014-12-21 LAB — CBC
HCT: 38.4 % — ABNORMAL LOW (ref 39.0–52.0)
Hemoglobin: 13.1 g/dL (ref 13.0–17.0)
MCH: 30.8 pg (ref 26.0–34.0)
MCHC: 34.1 g/dL (ref 30.0–36.0)
MCV: 90.4 fL (ref 78.0–100.0)
PLATELETS: 166 10*3/uL (ref 150–400)
RBC: 4.25 MIL/uL (ref 4.22–5.81)
RDW: 12.9 % (ref 11.5–15.5)
WBC: 10 10*3/uL (ref 4.0–10.5)

## 2014-12-21 NOTE — Addendum Note (Signed)
Addendum  created 12/21/14 0905 by Mickel Baas, CRNA   Modules edited: Notes Section   Notes Section:  File: [id number]

## 2014-12-21 NOTE — Progress Notes (Signed)
Physical Therapy Treatment Patient Details Name: John Huffman MRN: [id number] DOB: 1928-07-05 Today's Date: 12/21/2014    History of Present Illness Pt is an 81 y/o male who reports a gradual decline in ambulation, and worsening of ambulatory pain in R knee over the previous 12 months. Pt presents today a few hours s/p R TKA.     PT Comments    Pt was mildly agitated at the time of my visit.  He had tried to get out of bed with the cryocuff and SCDs on and got all twisted up.  His wife was present.  She states that he is still mildly confused.  He is concerned about his inability to urinate although earlier he did have a mild sensation of needing to void.  RN was alerted who is now pushing fluids.  He was mildly hypotensive and the fluids resolved the slight lightheadedness he acknowledged.  Pt was initally instructed in gait with a walker, stable gait pattern for 80'.  He needed min assist to transfer back into bed and he received therapeutic exercise per protocol with left knee ROM= 10-95 degrees.  The CPM was initiated as ordered.  The orders were for 0-90 degrees but this was intolerable to pt.  He was able to tolerate 0-80 degrees and I did ask him to alert [place] if this was too much over the duration.  He was instructed in protocol for progression at home.  Follow Up Recommendations  Home health PT     Equipment Recommendations  None recommended by PT    Recommendations for Other Services  none     Precautions / Restrictions Precautions Precautions: Knee Precaution Booklet Issued: No Restrictions RLE Weight Bearing: Weight bearing as tolerated    Mobility  Bed Mobility Overal bed mobility: Needs Assistance Bed Mobility: Sit to Supine       Sit to supine: Min assist   General bed mobility comments: pt more fatigued today and needed more assist to return to bed  Transfers Overall transfer level: Needs assistance Equipment used: Rolling walker (2 wheeled) Transfers: Sit  to/from Stand Sit to Stand: Min guard            Ambulation/Gait Ambulation/Gait assistance: Min guard Ambulation Distance (Feet): 80 Feet Assistive device: Rolling walker (2 wheeled) Gait Pattern/deviations: Decreased step length - left;Decreased stance time - left Gait velocity: appropriate for situation Gait velocity interpretation: <1.8 ft/sec, indicative of risk for recurrent falls     Stairs            Wheelchair Mobility    Modified [name] (Stroke Patients Only)       Balance Overall balance assessment: No apparent balance deficits (not formally assessed)                                  Cognition Arousal/Alertness: Awake/alert Behavior During Therapy: WFL for tasks assessed/performed Overall Cognitive Status: Within Functional Limits for tasks assessed                      Exercises Total Joint Exercises Ankle Circles/Pumps: AROM;Both;15 reps;Supine Quad Sets: 10 reps;Right;AROM;Strengthening;Supine Short Arc Quad: AROM;Strengthening;Right;10 reps;Supine Heel Slides: AAROM;Strengthening;Right;10 reps;Supine Hip ABduction/ADduction: AROM;Strengthening;10 reps;Supine Straight Leg Raises: AROM;Strengthening;10 reps;Supine;Right Goniometric ROM: 8-90 degrees, left knee    General Comments        Pertinent Vitals/Pain Pain Assessment: 0-10 Pain Score: 7  Pain Location: right knee Pain Descriptors /  Indicators: Aching Pain Intervention(s): Limited activity within patient's tolerance;Repositioned;Patient requesting pain meds-RN notified;Ice applied    Home Living                      Prior Function            PT Goals (current goals can now be found in the care plan section) Progress towards PT goals: Progressing toward goals    Frequency  BID    PT Plan Current plan remains appropriate    Co-evaluation             End of Session Equipment Utilized During Treatment: Gait belt Activity Tolerance:  Patient tolerated treatment well;No increased pain;Patient limited by fatigue Patient left: in bed;with call bell/phone within reach;with family/visitor present     Time: 1355-1451 PT Time Calculation (min) (ACUTE ONLY): 56 min  Charges:  $Gait Training: 8-22 mins $Therapeutic Exercise: 8-22 mins $Therapeutic Activity: 8-22 mins $Self Care/Home Management: 8-22                    G CodesSable Feil  PT 12/21/2014, 4:11 PM [phone]

## 2014-12-21 NOTE — Progress Notes (Signed)
Subjective: Postop day 1 status post right total knee. The patient did well with therapy yesterday and has some mild discomfort.  Objective: Vital signs in last 24 hours: Temp:  [97.1 F (36.2 C)-98.1 F (36.7 C)] 97.1 F (36.2 C) (09/01 7948) Pulse Rate:  [58-81] 81 (09/01 0614) Resp:  [9-23] 20 (09/01 0614) BP: (101-142)/(41-96) 122/62 mmHg (09/01 0614) SpO2:  [93 %-99 %] 93 % (09/01 0614) Weight:  [180 lb (81.647 kg)] 180 lb (81.647 kg) (08/31 1250)  Intake/Output from previous day: 08/31 0701 - 09/01 0700 In: 3656.7 [P.O.:360; I.V.:3186.7; IV Piggyback:110] Out: 2375 [Urine:2100; Drains:225; Blood:50] Intake/Output this shift:    No results for input(s): HGB in the last 72 hours. No results for input(s): WBC, RBC, HCT, PLT in the last 72 hours. No results for input(s): NA, K, CL, CO2, BUN, CREATININE, GLUCOSE, CALCIUM in the last 72 hours. No results for input(s): LABPT, INR in the last 72 hours.  Neurologically intact Neurovascular intact Sensation intact distally Dorsiflexion/Plantar flexion intact  Assessment/Plan: Continue physical therapy continue CPM machine weight-bear as tolerated start discharge planning   Arther Abbott 12/21/2014, 7:06 AM

## 2014-12-21 NOTE — Care Management Note (Signed)
Case Management Note  Patient Details  Name: John Huffman MRN: [id number] Date of Birth: Oct 18, 1928  Subjective/Objective:                  Pt admitted from home s/p right knee. Pt lives with his wife and will return home at discharge. Pt has a walker for home use. Pt has requested [facility] for PT and CPM.  Action/Plan: Romualdo Bolk of [facility] is aware of referral and will collect pts information from the chart.  Expected Discharge Date:  12/22/14               Expected Discharge Plan:  [place]  In-House Referral:  NA  Discharge planning Services  CM Consult  Post Acute Care Choice:  Durable Medical Equipment, Home Health Choice offered to:  Patient  DME Arranged:  CPM, Walker rolling DME Agency:  [place]:  PT [place]:  [place]  Status of Service:  Completed, signed off  Medicare Important Message Given:    Date Medicare IM Given:    Medicare IM give by:    Date Additional Medicare IM Given:    Additional Medicare Important Message give by:     If discussed at [place] of Stay Meetings, dates discussed:    Additional Comments:  Joylene Draft, RN 12/21/2014, 10:40 AM

## 2014-12-21 NOTE — Progress Notes (Signed)
Physical Therapy Treatment Patient Details Name: John Huffman MRN: [id number] DOB: 1928/07/21 Today's Date: 12/21/2014    History of Present Illness Pt is an 81 y/o male who reports a gradual decline in ambulation, and worsening of ambulatory pain in R knee over the previous 12 months. He underwent left TKR on 12-20-14.  PT Comments     Pt was up in a chair at the time of my arrival.  He reported moderate pain in the left knee.  He was instructed in transfers and gait with a walker, WBAT left.  He was able to ambulate 51' with a stable gait pattern.  He was instructed in TKR protocol and precautions.  He tolerated therapeutic exercise well with left knee ROM= 10-90 degrees, AA.  His wife was instructed in operation of the [place].  We will begin CPM at the next visit.  Stair training must be done before he is discharged to home.  Follow Up Recommendations  Home health PT     Equipment Recommendations  None recommended by PT    Recommendations for Other Services  none     Precautions / Restrictions Precautions Precautions: Knee Precaution Booklet Issued: No Restrictions Weight Bearing Restrictions: Yes RLE Weight Bearing: Weight bearing as tolerated    Mobility  Bed Mobility Overal bed mobility: Needs Assistance Bed Mobility: Supine to Sit     Supine to sit: Modified independent (Device/Increase time) Sit to supine: Min assist   General bed mobility comments: pt more fatigued today and needed more assist to return to bed  Transfers Overall transfer level: Needs assistance Equipment used: Rolling walker (2 wheeled) Transfers: Sit to/from Stand Sit to Stand: Min guard Stand pivot transfers: Supervision       General transfer comment: verbal cues for safety with rolling walker  Ambulation/Gait Ambulation/Gait assistance: Min guard Ambulation Distance (Feet): 50 Feet Assistive device: Rolling walker (2 wheeled) Gait Pattern/deviations: Decreased step length -  left;Decreased stance time - left Gait velocity: appropriate for situation Gait velocity interpretation: <1.8 ft/sec, indicative of risk for recurrent falls     Stairs            Wheelchair Mobility    Modified [name] (Stroke Patients Only)       Balance Overall balance assessment: No apparent balance deficits (not formally assessed)                                  Cognition Arousal/Alertness: Awake/alert Behavior During Therapy: WFL for tasks assessed/performed Overall Cognitive Status: Within Functional Limits for tasks assessed                      Exercises Total Joint Exercises Ankle Circles/Pumps: AROM;Both;15 reps;Supine Quad Sets: 10 reps;Right;AROM;Strengthening;Supine Short Arc Quad: AROM;Strengthening;Right;10 reps;Supine Heel Slides: AAROM;Strengthening;Right;10 reps;Supine Straight Leg Raises: AROM;Strengthening;10 reps;Supine;Right Goniometric ROM: 10-90    General Comments        Pertinent Vitals/Pain Pain Assessment: No/denies pain Pain Score: 7  Pain Location: right knee Pain Descriptors / Indicators: Aching Pain Intervention(s): Limited activity within patient's tolerance;Premedicated before session;Patient requesting pain meds-RN notified;Ice applied    Home Living Family/patient expects to be discharged to:: Private residence Living Arrangements: Spouse/significant other Available Help at Discharge: Family Type of Home: House       Home Equipment: Gilford Rile - 2 wheels;Shower seat      Prior Function Level of Independence: Independent  PT Goals (current goals can now be found in the care plan section) Progress towards PT goals: Progressing toward goals    Frequency  BID    PT Plan Current plan remains appropriate    Co-evaluation             End of Session Equipment Utilized During Treatment: Gait belt Activity Tolerance: Patient tolerated treatment well;No increased pain;Patient limited  by fatigue Patient left: in bed;with call bell/phone within reach;with family/visitor present     Time: 8841-6606 PT Time Calculation (min) (ACUTE ONLY): 47 min  Charges:  $Gait Training: 8-22 mins $Therapeutic Exercise: 8-22 mins $Self Care/Home Management: 8-22                    G CodesSable Feil  PT 12/21/2014, 12:32 PM [phone]

## 2014-12-21 NOTE — Anesthesia Postprocedure Evaluation (Signed)
  Anesthesia Post-op Note  Patient: John Huffman  Procedure(s) Performed: Procedure(s): TOTAL KNEE ARTHROPLASTY (Right)   Patient Location: Room 332  Anesthesia Type:Spinal  Level of Consciousness: awake, alert , oriented and patient cooperative  Airway and Oxygen Therapy: Patient Spontanous Breathing  Post-op Pain: mild  Post-op Assessment: Post-op Vital signs reviewed, Patient's Cardiovascular Status Stable, Respiratory Function Stable, Patent Airway, No signs of Nausea or vomiting and Pain level controlled Purposeful movement bilateral lower extremities; full sensation bilateral lower extremities. Post-op Vital Signs: Reviewed and stable  Last Vitals:  Filed Vitals:   12/21/14 0614  BP: 122/62  Pulse: 81  Temp: 36.2 C  Resp: 20    Complications: No apparent anesthesia complications

## 2014-12-21 NOTE — Evaluation (Signed)
Occupational Therapy Evaluation Patient Details Name: John Huffman MRN: [id number] DOB: 1929-02-10 Today's Date: 12/21/2014    History of Present Illness Pt is an 81 y/o male who reports a gradual decline in ambulation, and worsening of ambulatory pain in R knee over the previous 12 months. Pt presents today a few hours s/p R TKA.    Clinical Impression   Pt awake, alert, and oriented x3 this am. Pt reports no pain, using ice this am. Pt demonstrates good BUE range of motion, strength 4+/5. Pt reports independence in ADL tasks prior to surgery. Pt agreeable to sit up in chair, modified independence in bed mobility, min assist for sit-stand transfer, supervision for stand-pivot to chair. Pt is at baseline for ADL tasks, good support system at home on discharge. No further acute OT services required.     Follow Up Recommendations  No OT follow up    Equipment Recommendations  None recommended by OT       Precautions / Restrictions Precautions Precautions: None Restrictions Weight Bearing Restrictions: Yes RLE Weight Bearing: Weight bearing as tolerated      Mobility Bed Mobility Overal bed mobility: Modified Independent Bed Mobility: Supine to Sit     Supine to sit: Modified independent (Device/Increase time)        Transfers Overall transfer level: Needs assistance Equipment used: Rolling walker (2 wheeled) Transfers: Sit to/from [facility] Sit to Stand: Min assist Stand pivot transfers: Supervision       General transfer comment: verbal cues for safety with rolling walker         ADL Overall ADL's : Needs assistance/impaired                                       General ADL Comments: Pt requires assistance with LB dressing tasks as he is pain limited.                Pertinent Vitals/Pain Pain Assessment: No/denies pain     Hand Dominance Right   Extremity/Trunk Assessment Upper Extremity Assessment Upper  Extremity Assessment: Overall WFL for tasks assessed   Lower Extremity Assessment Lower Extremity Assessment: Defer to PT evaluation       Communication Communication Communication: No difficulties   Cognition Arousal/Alertness: Awake/alert Behavior During Therapy: WFL for tasks assessed/performed Overall Cognitive Status: Within Functional Limits for tasks assessed                                Home Living Family/patient expects to be discharged to:: Private residence Living Arrangements: Spouse/significant other Available Help at Discharge: Family Type of Home: House             Bathroom Shower/Tub: Walk-in Corporate treasurer Toilet: Standard     Home Equipment: Environmental consultant - 2 wheels;Shower seat          Prior Functioning/Environment Level of Independence: Independent              End of Session Equipment Utilized During Treatment: Gait belt;Rolling walker  Activity Tolerance: Patient tolerated treatment well Patient left: in chair;with call bell/phone within reach;with chair alarm set   Time: [phone] OT Time Calculation (min): 34 min Charges:  OT General Charges $OT Visit: 1 Procedure OT Evaluation $Initial OT Evaluation Tier I: 1 Procedure   Guadelupe Sabin, OTR/L  [phone]  12/21/2014,  10:10 AM

## 2014-12-22 LAB — CBC
HCT: 39 % (ref 39.0–52.0)
HEMOGLOBIN: 13.1 g/dL (ref 13.0–17.0)
MCH: 30.2 pg (ref 26.0–34.0)
MCHC: 33.6 g/dL (ref 30.0–36.0)
MCV: 89.9 fL (ref 78.0–100.0)
Platelets: 190 10*3/uL (ref 150–400)
RBC: 4.34 MIL/uL (ref 4.22–5.81)
RDW: 12.8 % (ref 11.5–15.5)
WBC: 12.6 10*3/uL — ABNORMAL HIGH (ref 4.0–10.5)

## 2014-12-22 MED ORDER — SODIUM CHLORIDE 0.9 % IV SOLN: 250.0000 mL | INTRAVENOUS | Status: DC | PRN

## 2014-12-22 MED ORDER — SODIUM CHLORIDE 0.9 % IJ SOLN: 3.0000 mL | INTRAMUSCULAR | Status: DC | PRN

## 2014-12-22 MED ORDER — SODIUM CHLORIDE 0.9 % IJ SOLN
3.0000 mL | Freq: Two times a day (BID) | INTRAMUSCULAR | Status: DC
  Administered 2014-12-22 – 2014-12-23 (×2): 3 mL via INTRAVENOUS

## 2014-12-22 MED ORDER — HYDROCODONE-ACETAMINOPHEN 5-325 MG PO TABS
1.0000 | ORAL_TABLET | ORAL | Status: DC | PRN
  Administered 2014-12-22 – 2014-12-23 (×2): 1 via ORAL
  Filled 2014-12-22 (×2): qty 1

## 2014-12-22 NOTE — Progress Notes (Signed)
Physical Therapy Treatment Patient Details Name: John Huffman MRN: [id number] DOB: 08/30/1928 Today's Date: 12/22/2014    History of Present Illness Pt is an 81 y/o male who reports a gradual decline in ambulation, and worsening of ambulatory pain in R knee over the previous 12 months. Pt presents today a few hours s/p R TKA.     PT Comments    Pt is progressing extremely well with no pain at rest, minimal edema and 10-104 degrees of AA ROM right knee.  He was able to ambulate 200' with a walker, stable gait pattern.  He will need to be instructed in steps prior to discharge.  He has stated that he would like to stay in the hospital until tomorrow and given his age this would be very reasonable.  Follow Up Recommendations  Home health PT     Equipment Recommendations  Rolling walker with 5" wheels (walker is borrowed and very old with small wheels)    Recommendations for Other Services  none     Precautions / Restrictions Precautions Precautions: Knee Precaution Booklet Issued: No Restrictions RLE Weight Bearing: Weight bearing as tolerated    Mobility  Bed Mobility Overal bed mobility: Modified Independent       Supine to sit: Modified independent (Device/Increase time)        Transfers Overall transfer level: Modified independent Equipment used: Rolling walker (2 wheeled) Transfers: Sit to/from Stand Sit to Stand: Supervision            Ambulation/Gait Ambulation/Gait assistance: Supervision Ambulation Distance (Feet): 200 Feet Assistive device: Rolling walker (2 wheeled) Gait Pattern/deviations: WFL(Within Functional Limits) Gait velocity: needed cues to slow gait Gait velocity interpretation: >2.62 ft/sec, indicative of independent Presenter, broadcasting [name] (Stroke Patients Only)       Balance Overall balance assessment: No apparent balance deficits (not formally assessed)                                   Cognition Arousal/Alertness: Awake/alert Behavior During Therapy: WFL for tasks assessed/performed Overall Cognitive Status: Within Functional Limits for tasks assessed                      Exercises Total Joint Exercises Ankle Circles/Pumps: AROM;Both;15 reps;Supine Quad Sets: 10 reps;Right;AROM;Strengthening;Supine Short Arc Quad: AROM;Strengthening;Right;10 reps;Supine Heel Slides: AAROM;Strengthening;Right;10 reps;Supine Straight Leg Raises: AROM;Strengthening;10 reps;Supine;Right Goniometric ROM: 10-104 degrees,.AA rght knee    General Comments        Pertinent Vitals/Pain Pain Assessment: No/denies pain    Home Living                      Prior Function            PT Goals (current goals can now be found in the care plan section) Progress towards PT goals: Progressing toward goals    Frequency  BID    PT Plan Current plan remains appropriate    Co-evaluation             End of Session Equipment Utilized During Treatment: Gait belt Activity Tolerance: Patient tolerated treatment well;No increased pain Patient left: in chair;with call bell/phone within reach;with family/visitor present     Time: 6378-5885 PT Time Calculation (min) (ACUTE ONLY): 38 min  Charges:  $Gait Training: 8-22  mins $Therapeutic Exercise: 8-22 mins $Neuromuscular Re-education: 8-22 mins                    G Codes:      Sable Feil  PT  12/22/2014, 10:55 AM [phone]

## 2014-12-22 NOTE — Care Management Important Message (Signed)
Important Message  Patient Details  Name: John Huffman MRN: [id number] Date of Birth: 1928-05-22   Medicare Important Message Given:  Yes-second notification given    Sherald Barge, RN 12/22/2014, 2:42 PM

## 2014-12-22 NOTE — Care Management Note (Signed)
Case Management Note  Patient Details  Name: John Huffman MRN: [id number] Date of Birth: 05-16-1928  Expected Discharge Date:  12/22/14               Expected Discharge Plan:  [place]  In-House Referral:  NA  Discharge planning Services  CM Consult  Post Acute Care Choice:  Durable Medical Equipment, Home Health Choice offered to:  Patient  DME Arranged:  CPM, Walker rolling DME Agency:  [place]:  PT [facility] Agency:  [place]  Status of Service:  Completed, signed off  Medicare Important Message Given:  Yes-second notification given Date Medicare IM Given:    Medicare IM give by:    Date Additional Medicare IM Given:    Additional Medicare Important Message give by:     If discussed at [place] of Stay Meetings, dates discussed:    Additional Comments: [place] home with [facility] PT on 12/23/2014. Pt's CPM and walker will be delivered to pt's home after DC. Vaughan Basta, of [facility], made aware of DC plan. No further CM needs at this time. Discharging RN will notify [facility] when pt leaves hospital.  Sherald Barge, RN 12/22/2014, 2:42 PM

## 2014-12-22 NOTE — Progress Notes (Signed)
Physical Therapy Treatment Patient Details Name: John Huffman MRN: [id number] DOB: June 06, 1928 Today's Date: 12/22/2014    History of Present Illness Pt is an 81 y/o male who reports a gradual decline in ambulation, and worsening of ambulatory pain in R knee over the previous 12 months. Pt presents today a few hours s/p R TKA.     PT Comments     Pt has made excellent progress and has met acute care PT goals.  His right knee ROM is 8-106 degrees, AA.  He is independent with transfers and gait on the level and steps.  He has been instructed in a  Home exercise program and given written instructions.  His RLE was placed in CPM at 0-90 degrees, tolerated well.  Follow Up Recommendations  Home health PT     Equipment Recommendations  Rolling walker with 5" wheels    Recommendations for Other Services  none     Precautions / Restrictions Precautions Precautions: Knee Precaution Booklet Issued: Yes (comment) Restrictions Weight Bearing Restrictions: No RLE Weight Bearing: Weight bearing as tolerated    Mobility  Bed Mobility Overal bed mobility: Modified Independent Bed Mobility: Sit to Supine     Supine to sit: Modified independent (Device/Increase time) Sit to supine: Modified independent (Device/Increase time)      Transfers Overall transfer level: Modified independent Equipment used: Rolling walker (2 wheeled) Transfers: Sit to/from Stand Sit to Stand: Supervision            Ambulation/Gait Ambulation/Gait assistance: Supervision Ambulation Distance (Feet): 250 Feet Assistive device: Rolling walker (2 wheeled) Gait Pattern/deviations: WFL(Within Functional Limits) Gait velocity: needed cues to slow gait Gait velocity interpretation: >2.62 ft/sec, indicative of independent community ambulator     Stairs   Stairs assistance: Modified independent (Device/Increase time) Stair Management: Two rails;Step to pattern;Forwards Number of Stairs: 10     Wheelchair Mobility    Modified [name] (Stroke Patients Only)       Balance Overall balance assessment: No apparent balance deficits (not formally assessed)                                  Cognition Arousal/Alertness: Awake/alert Behavior During Therapy: WFL for tasks assessed/performed Overall Cognitive Status: Within Functional Limits for tasks assessed                      Exercises Total Joint Exercises Ankle Circles/Pumps: AROM;Both;15 reps;Supine Quad Sets: 10 reps;Right;AROM;Strengthening;Supine Short Arc Quad: AROM;Strengthening;Right;10 reps;Supine Heel Slides: AAROM;Strengthening;Right;10 reps;Supine Straight Leg Raises: AROM;Strengthening;10 reps;Supine;Right Goniometric ROM: 8-106 degrees, AA ROM right knee    General Comments        Pertinent Vitals/Pain Pain Assessment: No/denies pain    Home Living                      Prior Function            PT Goals (current goals can now be found in the care plan section) Progress towards PT goals: Goals met/education completed, patient discharged from PT    Frequency  BID    PT Plan Current plan remains appropriate    Co-evaluation             End of Session Equipment Utilized During Treatment: Gait belt Activity Tolerance: Patient tolerated treatment well;No increased pain Patient left: in bed;in CPM     Time: 1245-1330 PT Time Calculation (min) (ACUTE ONLY):  45 min  Charges:  $Gait Training: 8-22 mins $Therapeutic Exercise: 8-22 mins $Self Care/Home Management: 8-22                    G Codes:      Sable Feil  PT 12/22/2014, 2:46 PM [phone]

## 2014-12-23 LAB — CBC
HCT: 39.1 % (ref 39.0–52.0)
Hemoglobin: 13.1 g/dL (ref 13.0–17.0)
MCH: 30.3 pg (ref 26.0–34.0)
MCHC: 33.5 g/dL (ref 30.0–36.0)
MCV: 90.5 fL (ref 78.0–100.0)
PLATELETS: 181 10*3/uL (ref 150–400)
RBC: 4.32 MIL/uL (ref 4.22–5.81)
RDW: 13 % (ref 11.5–15.5)
WBC: 8.6 10*3/uL (ref 4.0–10.5)

## 2014-12-23 MED ORDER — HYDROCODONE-ACETAMINOPHEN 5-325 MG PO TABS: 1.0000 | ORAL_TABLET | ORAL | Status: DC | PRN

## 2014-12-23 MED ORDER — ASPIRIN 325 MG PO TBEC: 325.0000 mg | DELAYED_RELEASE_TABLET | Freq: Two times a day (BID) | ORAL | Status: DC

## 2014-12-23 NOTE — Progress Notes (Signed)
Home Health orders faxed over to [place], and called to notify [facility] of discharge

## 2014-12-23 NOTE — Progress Notes (Signed)
Pt IV removed, pt tolerated well.  Reviewed discharge instructions with pt, answered all questions.

## 2014-12-23 NOTE — Discharge Summary (Signed)
Physician Discharge Summary  Patient ID: John Huffman MRN: [id number] DOB/AGE: 1928-08-21 81 y.o.  Admit date: 12/20/2014 Discharge date: 12/23/2014  Admission Diagnoses: Osteoarthritis right knee  Discharge Diagnoses: Same Active Problems:   Primary osteoarthritis of knee   Primary osteoarthritis of right knee   Discharged Condition: stable  Hospital Course:  Date of admission August 31. Patient underwent uncomplicated right total knee arthroplasty with [place] fixed-bearing Sigma posterior stabilized total knee. Spinal metastatic. No complications.  Postop day 1 September 1 the patient did very well with physical therapy ambulated well knee flexion advanced very well.  Postop day 2 he continued with excellent advancement in physical therapy weightbearing as tolerated knee flexion past 90 and gait greater than 50 feet  Postop day 3 afebrile vital signs were stable dressing was dry calf was supple no ankle edema no evidence of any infection. Stable for discharge home   Discharge Exam: Blood pressure 112/64, pulse 64, temperature 97.7 F (36.5 C), temperature source Oral, resp. rate 18, height 6' (1.829 m), weight 180 lb (81.647 kg), SpO2 98 %.   Disposition: 01-Home or Self Care  Discharge Instructions    CPM    Complete by:  As directed   Continuous passive motion machine (CPM):      Use the CPM from 0 to 80 for 6 hours per day.      You may increase by 10 per day.  You may break it up into 2 or 3 sessions per day.      Use CPM for 2 weeks or until you are told to stop.     Call MD / Call 911    Complete by:  As directed   If you experience chest pain or shortness of breath, CALL 911 and be transported to the hospital emergency room.  If you develope a fever above 101 F, pus (white drainage) or increased drainage or redness at the wound, or calf pain, call your surgeon's office.     Change dressing    Complete by:  As directed   Change dressing on pod # 7 , then change the  dressing daily with sterile 4 x 4 inch gauze dressing and apply TED hose.  You may clean the incision with alcohol prior to redressing.     Constipation Prevention    Complete by:  As directed   Drink plenty of fluids.  Prune juice may be helpful.  You may use a stool softener, such as Colace (over the counter) 100 mg twice a day.  Use MiraLax (over the counter) for constipation as needed.     Diet - low sodium heart healthy    Complete by:  As directed      Do not put a pillow under the knee. Place it under the heel.    Complete by:  As directed      Increase activity slowly as tolerated    Complete by:  As directed      TED hose    Complete by:  As directed   Use stockings (TED hose) for 2 weeks on both leg(s).  You may remove them at night for sleeping.            Medication List    STOP taking these medications        aspirin 81 MG tablet  Replaced by:  aspirin 325 MG EC tablet      TAKE these medications        amLODipine 5 MG  tablet  Commonly known as:  NORVASC  Take 5 mg by mouth daily.     aspirin 325 MG EC tablet  Take 1 tablet (325 mg total) by mouth 2 (two) times daily.     HYDROcodone-acetaminophen 5-325 MG per tablet  Commonly known as:  NORCO/VICODIN  Take 1 tablet by mouth every 4 (four) hours as needed for moderate pain.     losartan 100 MG tablet  Commonly known as:  COZAAR  Take 100 mg by mouth daily.     simvastatin 40 MG tablet  Commonly known as:  ZOCOR  Take 40 mg by mouth daily.     tamsulosin 0.4 MG Caps capsule  Commonly known as:  FLOMAX  Take 0.4 mg by mouth daily.           Follow-up Information    Follow up with [facility].   Contact information:   [address] [place] [address] [phone]       Follow up with Arther Abbott, MD.   Specialties:  Orthopedic Surgery, Radiology   Contact information:   [address] [place] [address] [phone]        Signed: Arther Abbott 12/23/2014, 11:20 AM

## 2014-12-24 LAB — TYPE AND SCREEN
ABO/RH(D): A POS
Antibody Screen: NEGATIVE
UNIT DIVISION: 0
Unit division: 0

## 2014-12-25 DIAGNOSIS — Z471 Aftercare following joint replacement surgery: Secondary | ICD-10-CM | POA: Diagnosis not present

## 2014-12-25 DIAGNOSIS — Z8546 Personal history of malignant neoplasm of prostate: Secondary | ICD-10-CM | POA: Diagnosis not present

## 2014-12-25 DIAGNOSIS — E785 Hyperlipidemia, unspecified: Secondary | ICD-10-CM | POA: Diagnosis not present

## 2014-12-25 DIAGNOSIS — I10 Essential (primary) hypertension: Secondary | ICD-10-CM | POA: Diagnosis not present

## 2014-12-25 DIAGNOSIS — Z96651 Presence of right artificial knee joint: Secondary | ICD-10-CM | POA: Diagnosis not present

## 2014-12-27 ENCOUNTER — Ambulatory Visit: Payer: Medicare Other | Admitting: Cardiology

## 2014-12-27 DIAGNOSIS — Z471 Aftercare following joint replacement surgery: Secondary | ICD-10-CM | POA: Diagnosis not present

## 2014-12-27 DIAGNOSIS — I10 Essential (primary) hypertension: Secondary | ICD-10-CM | POA: Diagnosis not present

## 2014-12-27 DIAGNOSIS — Z8546 Personal history of malignant neoplasm of prostate: Secondary | ICD-10-CM | POA: Diagnosis not present

## 2014-12-27 DIAGNOSIS — E785 Hyperlipidemia, unspecified: Secondary | ICD-10-CM | POA: Diagnosis not present

## 2014-12-27 DIAGNOSIS — Z96651 Presence of right artificial knee joint: Secondary | ICD-10-CM | POA: Diagnosis not present

## 2014-12-28 DIAGNOSIS — Z8546 Personal history of malignant neoplasm of prostate: Secondary | ICD-10-CM | POA: Diagnosis not present

## 2014-12-28 DIAGNOSIS — Z96651 Presence of right artificial knee joint: Secondary | ICD-10-CM | POA: Diagnosis not present

## 2014-12-28 DIAGNOSIS — E785 Hyperlipidemia, unspecified: Secondary | ICD-10-CM | POA: Diagnosis not present

## 2014-12-28 DIAGNOSIS — Z471 Aftercare following joint replacement surgery: Secondary | ICD-10-CM | POA: Diagnosis not present

## 2014-12-28 DIAGNOSIS — I10 Essential (primary) hypertension: Secondary | ICD-10-CM | POA: Diagnosis not present

## 2014-12-29 DIAGNOSIS — Z96651 Presence of right artificial knee joint: Secondary | ICD-10-CM | POA: Diagnosis not present

## 2014-12-29 DIAGNOSIS — Z8546 Personal history of malignant neoplasm of prostate: Secondary | ICD-10-CM | POA: Diagnosis not present

## 2014-12-29 DIAGNOSIS — I10 Essential (primary) hypertension: Secondary | ICD-10-CM | POA: Diagnosis not present

## 2014-12-29 DIAGNOSIS — Z471 Aftercare following joint replacement surgery: Secondary | ICD-10-CM | POA: Diagnosis not present

## 2014-12-29 DIAGNOSIS — E785 Hyperlipidemia, unspecified: Secondary | ICD-10-CM | POA: Diagnosis not present

## 2015-01-01 ENCOUNTER — Ambulatory Visit (INDEPENDENT_AMBULATORY_CARE_PROVIDER_SITE_OTHER): Payer: Self-pay | Admitting: Orthopedic Surgery

## 2015-01-01 VITALS — BP 131/65 | Ht 72.0 in | Wt 180.0 lb

## 2015-01-01 DIAGNOSIS — Z8546 Personal history of malignant neoplasm of prostate: Secondary | ICD-10-CM | POA: Diagnosis not present

## 2015-01-01 DIAGNOSIS — Z471 Aftercare following joint replacement surgery: Secondary | ICD-10-CM | POA: Diagnosis not present

## 2015-01-01 DIAGNOSIS — I10 Essential (primary) hypertension: Secondary | ICD-10-CM | POA: Diagnosis not present

## 2015-01-01 DIAGNOSIS — E785 Hyperlipidemia, unspecified: Secondary | ICD-10-CM | POA: Diagnosis not present

## 2015-01-01 DIAGNOSIS — Z96651 Presence of right artificial knee joint: Secondary | ICD-10-CM

## 2015-01-01 NOTE — Progress Notes (Signed)
Patient ID: John Huffman, male   DOB: 07/03/1928, 81 y.o.   MRN: [id number]  Chief Complaint  Patient presents with  . Follow-up    post op 1, right TKA, DOS 12/20/14    HPI John Huffman is a 81 y.o. male.  Postop visit #1 postop day #12 he has no complaints other than some mild discomfort requiring Tylenol. There is concern about his Zocor which may be causing some memory loss. He will follow-up with Dr. Willey Blade regarding whether or not he can come off this drug.   No Known Allergies  Current Outpatient Prescriptions  Medication Sig Dispense Refill  . amLODipine (NORVASC) 5 MG tablet Take 5 mg by mouth daily.    Marland Kitchen aspirin EC 325 MG EC tablet Take 1 tablet (325 mg total) by mouth 2 (two) times daily. 60 tablet 0  . losartan (COZAAR) 100 MG tablet Take 100 mg by mouth daily.    . simvastatin (ZOCOR) 40 MG tablet Take 40 mg by mouth daily.    . Tamsulosin HCl (FLOMAX) 0.4 MG CAPS Take 0.4 mg by mouth daily.     Marland Kitchen HYDROcodone-acetaminophen (NORCO/VICODIN) 5-325 MG per tablet Take 1 tablet by mouth every 4 (four) hours as needed for moderate pain. (Patient not taking: Reported on 01/01/2015) 30 tablet 0   No current facility-administered medications for this visit.      Physical Exam Physical Exam Blood pressure 131/65, height 6' (1.829 m), weight 180 lb (81.647 kg).  Appearance of incision: Incision is clean dry and intact we removed the staples  The calf was supple and the Homans sign was normal, there is minimal peripheral edema  Knee flexion was  95  Knee extension was  5  Gait with a walker  Assessment and plan The patient is doing well and is in good condition  Follow-up will be 4 weeks  Start outpatient therapy next week

## 2015-01-01 NOTE — Patient Instructions (Signed)
CALL [facility] THERAPY DEPT TO SCHEDULE OUTPATIENT THERAPY STARTING 01/08/15

## 2015-01-03 DIAGNOSIS — Z96651 Presence of right artificial knee joint: Secondary | ICD-10-CM | POA: Diagnosis not present

## 2015-01-03 DIAGNOSIS — Z8546 Personal history of malignant neoplasm of prostate: Secondary | ICD-10-CM | POA: Diagnosis not present

## 2015-01-03 DIAGNOSIS — I10 Essential (primary) hypertension: Secondary | ICD-10-CM | POA: Diagnosis not present

## 2015-01-03 DIAGNOSIS — E785 Hyperlipidemia, unspecified: Secondary | ICD-10-CM | POA: Diagnosis not present

## 2015-01-03 DIAGNOSIS — Z471 Aftercare following joint replacement surgery: Secondary | ICD-10-CM | POA: Diagnosis not present

## 2015-01-05 DIAGNOSIS — Z471 Aftercare following joint replacement surgery: Secondary | ICD-10-CM | POA: Diagnosis not present

## 2015-01-05 DIAGNOSIS — Z96651 Presence of right artificial knee joint: Secondary | ICD-10-CM | POA: Diagnosis not present

## 2015-01-05 DIAGNOSIS — I10 Essential (primary) hypertension: Secondary | ICD-10-CM | POA: Diagnosis not present

## 2015-01-05 DIAGNOSIS — Z8546 Personal history of malignant neoplasm of prostate: Secondary | ICD-10-CM | POA: Diagnosis not present

## 2015-01-05 DIAGNOSIS — E785 Hyperlipidemia, unspecified: Secondary | ICD-10-CM | POA: Diagnosis not present

## 2015-01-08 ENCOUNTER — Ambulatory Visit (HOSPITAL_COMMUNITY): Payer: Medicare Other | Attending: Orthopedic Surgery | Admitting: Physical Therapy

## 2015-01-08 DIAGNOSIS — M25661 Stiffness of right knee, not elsewhere classified: Secondary | ICD-10-CM | POA: Insufficient documentation

## 2015-01-08 DIAGNOSIS — R609 Edema, unspecified: Secondary | ICD-10-CM | POA: Insufficient documentation

## 2015-01-08 DIAGNOSIS — R262 Difficulty in walking, not elsewhere classified: Secondary | ICD-10-CM | POA: Insufficient documentation

## 2015-01-08 DIAGNOSIS — R29898 Other symptoms and signs involving the musculoskeletal system: Secondary | ICD-10-CM | POA: Insufficient documentation

## 2015-01-08 DIAGNOSIS — M25561 Pain in right knee: Secondary | ICD-10-CM | POA: Diagnosis not present

## 2015-01-08 NOTE — Therapy (Signed)
[place], [place], [address] Phone: [phone]   Fax:  [phone]  Physical Therapy Evaluation  Patient Details  Name: John Huffman MRN: [id number] Date of Birth: 1928/04/25 Referring Provider:  Carole Civil, MD  Encounter Date: 01/08/2015      PT End of Session - 01/08/15 1559    Visit Number 1   Number of Visits 18   Date for PT Re-Evaluation 02/07/15   Authorization Type medicare   Authorization - Visit Number 1   Authorization - Number of Visits 10   PT Start Time 8341   PT Stop Time 9622   PT Time Calculation (min) 40 min   Activity Tolerance Patient tolerated treatment well      Past Medical History  Diagnosis Date  . Hypertension   . High cholesterol   . Cancer     Proatate    Past Surgical History  Procedure Laterality Date  . Back surgery    . Appendectomy    . Vasectomy    . Total knee arthroplasty Right 12/20/2014    Procedure: TOTAL KNEE ARTHROPLASTY;  Surgeon: Carole Civil, MD;  Location: [facility];  Service: Orthopedics;  Laterality: Right;    There were no vitals filed for this visit.  Visit Diagnosis:  Knee stiffness, right  Weakness of right leg  Edema  Knee pain, acute, right  Difficulty walking down stairs      Subjective Assessment - 01/08/15 1514    Subjective John Huffman has had a recent Rt TKR on 8/31 he was discharged to [facility] on 12/23/2014 and is now being referred to out-patient therapy.    How long can you sit comfortably? Pt  states he is able to sit for 15 minutes without difficulty.    How long can you stand comfortably? Able to stand for ten minutes or longer.    How long can you walk comfortably? Pt is able to walk without an assistive device; he can walk for 30 mintues.     Patient Stated Goals to get back fully recovered no pain and can walk.   Currently in Pain? Yes   Pain Score 4    Pain Location Knee   Pain Orientation Right   Pain  Descriptors / Indicators Aching   Pain Type Acute pain;Surgical pain   Pain Onset 1 to 4 weeks ago   Pain Frequency Intermittent   Aggravating Factors  activity            [facility] PT Assessment - 01/08/15 1523    Assessment   Medical Diagnosis Rt TKR   Onset Date/Surgical Date 12/20/14   Hand Dominance Right   Next MD Visit 01/22/2015   Prior Therapy [facility]   Precautions   Precautions None   Required Braces or Orthoses --  none   Restrictions   Weight Bearing Restrictions No   Balance Screen   Has the patient fallen in the past 6 months No   Has the patient had a decrease in activity level because of a fear of falling?  Yes   Is the patient reluctant to leave their home because of a fear of falling?  No   Prior Function   Level of Independence Independent   Vocation Retired   Leisure Enjoys golfing, walking.    Cognition   Overall Cognitive Status Within Functional Limits for tasks assessed   Observation/Other Assessments   Focus on Therapeutic Outcomes (FOTO)  44  Functional Tests   Functional tests Single leg stance   Single Leg Stance   Comments Lt 10 seconds ; Rt 30    ROM / Strength   AROM / PROM / Strength AROM;Strength   AROM   AROM Assessment Site Knee   Right/Left Knee Right   Right Knee Extension 8   Right Knee Flexion 120   Strength   Strength Assessment Site Hip;Knee;Ankle   Right/Left Hip Right   Right Hip Flexion 5/5   Right Hip Extension 3+/5   Right Hip ABduction 3+/5   Right/Left Knee Right   Right Knee Flexion 4/5   Right Knee Extension 5/5   Right/Left Ankle Right   Right Ankle Dorsiflexion 5/5                   [facility] Adult PT Treatment/Exercise - 01/08/15 0001    Exercises   Exercises Knee/Hip   Knee/Hip Exercises: Stretches   Active Hamstring Stretch Right;2 reps;30 seconds   Active Hamstring Stretch Limitations supine   Knee/Hip Exercises: Standing   SLS x3 max of 30 seconds    Knee/Hip Exercises: Supine   Quad Sets  Strengthening;Right;10 reps   Knee/Hip Exercises: Sidelying   Hip ABduction Strengthening;Right;10 reps   Knee/Hip Exercises: Prone   Hamstring Curl 10 reps   Hip Extension Both;10 reps                PT Education - 01/08/15 1558    Education provided Yes   Education Details Continue to ice at least three times a day; HEP   Person(s) Educated Patient   Methods Explanation   Comprehension Verbalized understanding          PT Short Term Goals - 01/08/15 1608    PT SHORT TERM GOAL #1   Title I HEP    Time 2   Period Days   PT SHORT TERM GOAL #2   Title ROM to be 4-125 to allow more normal gait pattern   Time 2   Period Weeks   PT SHORT TERM GOAL #3   Title Pt to be ambulationg for  60 minutes at a time without difficulty   Time 2   Period Weeks   PT SHORT TERM GOAL #4   Title Pain level to be no greater than a 2/10 80% of the day.   Time 2   Period Weeks           PT Long Term Goals - 01/08/15 1609    PT LONG TERM GOAL #1   Title I in advance HEP   Time 4   Period Weeks   PT LONG TERM GOAL #2   Title Pt strength to be at least a 4+/5 to allow him to go up and down steps holding onto one handrail in a reciprocal manner.   Time 4   Period Weeks   PT LONG TERM GOAL #3   Title Pt to state no pain on an average day    Time 4   Period Weeks   PT LONG TERM GOAL #4   Title Pt ROM to be 0 to 125 for normal gait   Time 4   Period Weeks   PT LONG TERM GOAL #5   Title Pt to be playing 9 holes of 3 par golf course   Time 6   Period Weeks               Plan - 01/08/15 1601  Clinical Impression Statement John Huffman is an 81 yo who has had a recent Rt TKR.  He has been referred to skilled physcial therapy to improve his functioning level and quality of life.  Examination demonstrates decreased balance, decreased ROM, increased pain and increased edema.  John Huffman will benefit from skilled therapy to address these issues and return him to his  maximal functional lever.    Pt will benefit from skilled therapeutic intervention in order to improve on the following deficits Decreased activity tolerance;Decreased balance;Decreased range of motion;Decreased strength;Difficulty walking;Increased edema;Increased fascial restricitons;Pain   Rehab Potential Excellent   PT Frequency 3x / week   PT Duration 6 weeks   PT Treatment/Interventions ADLs/Self Care Home Management;Stair training;Functional mobility training;Therapeutic activities;Therapeutic exercise;Balance training;Manual techniques;Patient/family education;Scar mobilization;Passive range of motion   PT Next Visit Plan Begin gentle PROM for extension, standing and supine terminal extension, rockerboard. standing knee flexion with 4 #; SLS   PT Home Exercise Plan given    Consulted and Agree with Plan of Care Patient          G-Codes - 03-Feb-2015 1613    Functional Limitation Mobility: Walking and moving around   Mobility: Walking and Moving Around Current Status [phone]) At least 40 percent but less than 60 percent impaired, limited or restricted   Mobility: Walking and Moving Around Goal Status [phone]) At least 20 percent but less than 40 percent impaired, limited or restricted       Problem List Patient Active Problem List   Diagnosis Date Noted  . Primary osteoarthritis of right knee 12/20/2014  . Primary osteoarthritis of knee   . Trigger finger of right hand 08/04/2013  . Arthritis of hand 08/04/2013  . Hypersalivation 09/27/2012  . Right sided abdominal pain 09/27/2012  . Headache, temporal 09/27/2012  . Weight loss 09/27/2012  . Hypertension 02-03-2012  . High cholesterol Feb 03, 2012  . Abnormal x-ray Feb 03, 2012    Rayetta Humphrey, PT CLT [phone] 03-Feb-2015, 4:14 PM  [facility] [address] [place], [place], [address] Phone: [phone]   Fax:  [phone]

## 2015-01-08 NOTE — Patient Instructions (Addendum)
Strengthening: Quadriceps Set   Tighten muscles on top of thighs by pushing knees down into surface. Hold _3___ seconds. Repeat ___10_ times per set. Do _1___ sets per session. Do _3___ sessions per day.  http://orth.exer.us/602   Copyright  VHI. All rights reserved.  Stretching: Hamstring (Supine)   Supporting right thigh behind knee, slowly straighten knee until stretch is felt in back of thigh. Hold __30__ seconds. Repeat __3__ times per set. Do __1__ sets per session. Do __2_ sessions per day.  http://orth.exer.us/656   Copyright  VHI. All rights reserved.  Self-Mobilization: Heel Slide (Supine)   Slide left heel toward buttocks until a gentle stretch is felt. Hold 3____ seconds. Relax. Repeat __10__ times per set. Do __1__ sets per session. Do _2___ sessions per day.  http://orth.exer.us/710   Copyright  VHI. All rights reserved.  Strengthening: Hip Abduction (Side-Lying)   Tighten muscles on front of left thigh, then lift leg __18__ inches from surface, keeping knee locked.  Repeat __10__ times per set. Do1 ____ sets per session. Do __2__ sessions per day.  http://orth.exer.us/622   Copyright  VHI. All rights reserved.  Self-Mobilization: Knee Flexion (Prone)   Bring left heel toward buttocks as close as possible. Hold ___2_ seconds. Relax. Repeat _10___ times per set. Do _1___ sets per session. Do __2__ sessions per day.  http://orth.exer.us/596   Copyright  VHI. All rights reserved.  Strengthening: Hip Extension (Prone)   Tighten muscles on front of left thigh, then lift leg ___2_ inches from surface, keeping knee locked. Repeat _10___ times per set. Do 1____ sets per session. Do _2___ sessions per day.  http://orth.exer.us/620   Copyright  VHI. All rights reserved.

## 2015-01-10 ENCOUNTER — Ambulatory Visit (HOSPITAL_COMMUNITY): Payer: Medicare Other | Admitting: Physical Therapy

## 2015-01-10 DIAGNOSIS — R29898 Other symptoms and signs involving the musculoskeletal system: Secondary | ICD-10-CM

## 2015-01-10 DIAGNOSIS — R262 Difficulty in walking, not elsewhere classified: Secondary | ICD-10-CM

## 2015-01-10 DIAGNOSIS — M25561 Pain in right knee: Secondary | ICD-10-CM | POA: Diagnosis not present

## 2015-01-10 DIAGNOSIS — M25661 Stiffness of right knee, not elsewhere classified: Secondary | ICD-10-CM

## 2015-01-10 DIAGNOSIS — R609 Edema, unspecified: Secondary | ICD-10-CM | POA: Diagnosis not present

## 2015-01-10 NOTE — Therapy (Signed)
[place], [place], [address] Phone: [phone]   Fax:  [phone]  Physical Therapy Treatment  Patient Details  Name: John Huffman MRN: [id number] Date of Birth: [birth year] Referring Provider:  Asencion Noble, MD  Encounter Date: 01/10/2015      PT End of Session - 01/10/15 1329    Visit Number 2   Number of Visits 18   Date for PT Re-Evaluation 02/07/15   Authorization Type medicare   Authorization - Visit Number 2   Authorization - Number of Visits 10   PT Start Time 9449   PT Stop Time 1343   PT Time Calculation (min) 45 min      Past Medical History  Diagnosis Date  . Hypertension   . High cholesterol   . Cancer     Proatate    Past Surgical History  Procedure Laterality Date  . Back surgery    . Appendectomy    . Vasectomy    . Total knee arthroplasty Right 12/20/2014    Procedure: TOTAL KNEE ARTHROPLASTY;  Surgeon: Carole Civil, MD;  Location: [facility];  Service: Orthopedics;  Laterality: Right;    There were no vitals filed for this visit.  Visit Diagnosis:  Knee stiffness, right  Weakness of right leg  Edema  Knee pain, acute, right  Difficulty walking down stairs      Subjective Assessment - 01/10/15 1258    Subjective Mr. Dittus staes as long as he is not on his leg he is painfree but once he puts weight on it his pain increases   Currently in Pain? Yes   Pain Score 3    Pain Location Knee   Pain Orientation Right                 [facility] Adult PT Treatment/Exercise - 01/10/15 1300    Exercises   Exercises Knee/Hip   Knee/Hip Exercises: Stretches   Active Hamstring Stretch Right;3 reps;30 seconds   Active Hamstring Stretch Limitations supine    Knee: Self-Stretch to increase Flexion Left;5 reps;10 seconds   Knee: Self-Stretch Limitations 0n 18" step    Gastroc Stretch Both;3 reps;30 seconds   Gastroc Stretch Limitations slant board    Knee/Hip Exercises:  Standing   Heel Raises Both;10 reps   Knee Flexion Strengthening;Right;10 reps   Knee Flexion Limitations 4#   SLS x5 max 40 seconds    Knee/Hip Exercises: Supine   Quad Sets 10 reps   Short Arc Quad Sets 10 reps   Heel Slides 10 reps   Other Supine Knee/Hip Exercises PROM for flexion and extension x 3 each    Manual Therapy   Manual Therapy Edema management   Edema Management retro massage including decongestive techniques to reduce swelling for ijmproved ROM and decreased pain.                   PT Short Term Goals - 01/08/15 1608    PT SHORT TERM GOAL #1   Title I HEP    Time 2   Period Days   PT SHORT TERM GOAL #2   Title ROM to be 4-125 to allow more normal gait pattern   Time 2   Period Weeks   PT SHORT TERM GOAL #3   Title Pt to be ambulationg for  60 minutes at a time without difficulty   Time 2   Period Weeks   PT SHORT TERM GOAL #4  Title Pain level to be no greater than a 2/10 80% of the day.   Time 2   Period Weeks           PT Long Term Goals - 01/08/15 1609    PT LONG TERM GOAL #1   Title I in advance HEP   Time 4   Period Weeks   PT LONG TERM GOAL #2   Title Pt strength to be at least a 4+/5 to allow him to go up and down steps holding onto one handrail in a reciprocal manner.   Time 4   Period Weeks   PT LONG TERM GOAL #3   Title Pt to state no pain on an average day    Time 4   Period Weeks   PT LONG TERM GOAL #4   Title Pt ROM to be 0 to 125 for normal gait   Time 4   Period Weeks   PT LONG TERM GOAL #5   Title Pt to be playing 9 holes of 3 par golf course   Time 6   Period Weeks               Plan - 01/10/15 1330    Clinical Impression Statement Pt therapy concentrated on closed chained exercises with therapist facilitation for proper technique followed by manual techniques to decrease swelling.  Pt verbalizes decreased pain upon leaving clinic.     PT Next Visit Plan begin step up exercises with 4" step.          Problem List Patient Active Problem List   Diagnosis Date Noted  . Primary osteoarthritis of right knee 12/20/2014  . Primary osteoarthritis of knee   . Trigger finger of right hand 08/04/2013  . Arthritis of hand 08/04/2013  . Hypersalivation 09/27/2012  . Right sided abdominal pain 09/27/2012  . Headache, temporal 09/27/2012  . Weight loss 09/27/2012  . Hypertension 01/08/2012  . High cholesterol 01/08/2012  . Abnormal x-ray 01/08/2012    Rayetta Humphrey, PT CLT [phone] 01/10/2015, 1:42 PM  [facility] [address] [place], [place], [address] Phone: [phone]   Fax:  [phone]

## 2015-01-12 ENCOUNTER — Ambulatory Visit (HOSPITAL_COMMUNITY): Payer: Medicare Other | Admitting: Physical Therapy

## 2015-01-12 DIAGNOSIS — R262 Difficulty in walking, not elsewhere classified: Secondary | ICD-10-CM | POA: Diagnosis not present

## 2015-01-12 DIAGNOSIS — M25561 Pain in right knee: Secondary | ICD-10-CM

## 2015-01-12 DIAGNOSIS — R29898 Other symptoms and signs involving the musculoskeletal system: Secondary | ICD-10-CM

## 2015-01-12 DIAGNOSIS — M25661 Stiffness of right knee, not elsewhere classified: Secondary | ICD-10-CM | POA: Diagnosis not present

## 2015-01-12 DIAGNOSIS — R609 Edema, unspecified: Secondary | ICD-10-CM | POA: Diagnosis not present

## 2015-01-12 NOTE — Therapy (Signed)
[place], [place], [address] Phone: [phone]   Fax:  [phone]  Physical Therapy Treatment  Patient Details  Name: John Huffman MRN: [id number] Date of Birth: [birth year] Referring Provider:  Asencion Noble, MD  Encounter Date: 01/12/2015      PT End of Session - 01/12/15 1615    Visit Number 3   Number of Visits 18   Date for PT Re-Evaluation 02/07/15   Authorization Type medicare   Authorization - Visit Number 3   Authorization - Number of Visits 10   PT Start Time 6644   PT Stop Time 1428   PT Time Calculation (min) 43 min   Activity Tolerance Patient tolerated treatment well   Behavior During Therapy [facility] for tasks assessed/performed      Past Medical History  Diagnosis Date  . Hypertension   . High cholesterol   . Cancer     Proatate    Past Surgical History  Procedure Laterality Date  . Back surgery    . Appendectomy    . Vasectomy    . Total knee arthroplasty Right 12/20/2014    Procedure: TOTAL KNEE ARTHROPLASTY;  Surgeon: Carole Civil, MD;  Location: [facility];  Service: Orthopedics;  Laterality: Right;    There were no vitals filed for this visit.  Visit Diagnosis:  Knee stiffness, right  Weakness of right leg  Knee pain, acute, right      Subjective Assessment - 01/12/15 1349    Subjective Pt denies having any pain today, states that his leg still bothers him at night.    Currently in Pain? No/denies   Pain Score 0-No pain                [facility] Adult PT Treatment/Exercise - 01/12/15 0001    Knee/Hip Exercises: Stretches   Active Hamstring Stretch Right;3 reps;30 seconds   Active Hamstring Stretch Limitations 12" step   Knee: Self-Stretch to increase Flexion 10 seconds   Knee: Self-Stretch Limitations 10 reps at 12" step   Gastroc Stretch Both;3 reps;30 seconds   Gastroc Stretch Limitations slant board    Knee/Hip Exercises: Standing   Forward Lunges 10 reps    Forward Lunges Limitations 6" step   Side Lunges 10 reps   Side Lunges Limitations 6" step   Lateral Step Up 10 reps;Step Height: 4"   Forward Step Up 10 reps;Step Height: 4"   SLS x3 max 53" on RLE   Other Standing Knee Exercises sidestepping RTB x 2 RT   Knee/Hip Exercises: Supine   Quad Sets 10 reps   Quad Sets Limitations 3" hold   Short Arc Quad Sets 15 reps   Heel Slides 10 reps   Straight Leg Raises 10 reps                  PT Short Term Goals - 01/08/15 1608    PT SHORT TERM GOAL #1   Title I HEP    Time 2   Period Days   PT SHORT TERM GOAL #2   Title ROM to be 4-125 to allow more normal gait pattern   Time 2   Period Weeks   PT SHORT TERM GOAL #3   Title Pt to be ambulationg for  60 minutes at a time without difficulty   Time 2   Period Weeks   PT SHORT TERM GOAL #4   Title Pain level to be no greater than a  2/10 80% of the day.   Time 2   Period Weeks           PT Long Term Goals - 01/08/15 1609    PT LONG TERM GOAL #1   Title I in advance HEP   Time 4   Period Weeks   PT LONG TERM GOAL #2   Title Pt strength to be at least a 4+/5 to allow him to go up and down steps holding onto one handrail in a reciprocal manner.   Time 4   Period Weeks   PT LONG TERM GOAL #3   Title Pt to state no pain on an average day    Time 4   Period Weeks   PT LONG TERM GOAL #4   Title Pt ROM to be 0 to 125 for normal gait   Time 4   Period Weeks   PT LONG TERM GOAL #5   Title Pt to be playing 9 holes of 3 par golf course   Time 6   Period Weeks               Plan - 01/12/15 1616    Clinical Impression Statement Therex was progressed with addition of standing lunges, step ups, and sidestepping with theraband to improve hip and knee strength. Pt required verbal and tactile cueing for proper form during forward and side lunges, but denied any pain with progression of therex. Pt demonstrates good prognosis to progress further next treatment.    PT  Next Visit Plan Begin SLS vectors, continue with standing therex        Problem List Patient Active Problem List   Diagnosis Date Noted  . Primary osteoarthritis of right knee 12/20/2014  . Primary osteoarthritis of knee   . Trigger finger of right hand 08/04/2013  . Arthritis of hand 08/04/2013  . Hypersalivation 09/27/2012  . Right sided abdominal pain 09/27/2012  . Headache, temporal 09/27/2012  . Weight loss 09/27/2012  . Hypertension 01/08/2012  . High cholesterol 01/08/2012  . Abnormal x-ray 01/08/2012    Hilma Favors, PT, DPT [phone] 01/12/2015, 4:18 PM  [facility] [address] [place], [place], [address] Phone: [phone]   Fax:  [phone]

## 2015-01-15 ENCOUNTER — Ambulatory Visit (HOSPITAL_COMMUNITY): Payer: Medicare Other | Admitting: Physical Therapy

## 2015-01-15 DIAGNOSIS — R29898 Other symptoms and signs involving the musculoskeletal system: Secondary | ICD-10-CM | POA: Diagnosis not present

## 2015-01-15 DIAGNOSIS — M25661 Stiffness of right knee, not elsewhere classified: Secondary | ICD-10-CM

## 2015-01-15 DIAGNOSIS — R609 Edema, unspecified: Secondary | ICD-10-CM | POA: Diagnosis not present

## 2015-01-15 DIAGNOSIS — R262 Difficulty in walking, not elsewhere classified: Secondary | ICD-10-CM | POA: Diagnosis not present

## 2015-01-15 DIAGNOSIS — M25561 Pain in right knee: Secondary | ICD-10-CM

## 2015-01-15 NOTE — Therapy (Signed)
[place], [place], [address] Phone: [phone]   Fax:  [phone]  Physical Therapy Treatment  Patient Details  Name: John Huffman MRN: [id number] Date of Birth: 1928-12-14 Referring Provider:  Carole Civil, MD  Encounter Date: 01/15/2015      PT End of Session - 01/15/15 1345    Visit Number 4   Number of Visits 18   Date for PT Re-Evaluation 02/07/15   Authorization Type medicare   Authorization - Visit Number 4   Authorization - Number of Visits 10   PT Start Time 1300   PT Stop Time 1344   PT Time Calculation (min) 44 min   Activity Tolerance Patient tolerated treatment well   Behavior During Therapy [facility] for tasks assessed/performed      Past Medical History  Diagnosis Date  . Hypertension   . High cholesterol   . Cancer     Proatate    Past Surgical History  Procedure Laterality Date  . Back surgery    . Appendectomy    . Vasectomy    . Total knee arthroplasty Right 12/20/2014    Procedure: TOTAL KNEE ARTHROPLASTY;  Surgeon: Carole Civil, MD;  Location: [facility];  Service: Orthopedics;  Laterality: Right;    There were no vitals filed for this visit.  Visit Diagnosis:  Knee stiffness, right  Weakness of right leg  Knee pain, acute, right      Subjective Assessment - 01/15/15 1303    Subjective Pt reports that he has been having more pain with walking since the last time that he was here. He still has difficulty getting up and down.    Currently in Pain? No/denies   Pain Score 0-No pain                         [facility] Adult PT Treatment/Exercise - 01/15/15 0001    Knee/Hip Exercises: Stretches   Active Hamstring Stretch Right;3 reps;30 seconds   Active Hamstring Stretch Limitations 12" step   Knee: Self-Stretch to increase Flexion 10 seconds   Knee: Self-Stretch Limitations 10 reps at 12" step   Gastroc Stretch Both;3 reps;30 seconds   Gastroc  Stretch Limitations slant board    Knee/Hip Exercises: Standing   Heel Raises 15 reps   Forward Lunges 10 reps   Forward Lunges Limitations 6" step   Side Lunges 10 reps   Side Lunges Limitations 6" step   Lateral Step Up 10 reps;Step Height: 4"   Forward Step Up 10 reps;Step Height: 4"   Knee/Hip Exercises: Supine   Quad Sets 10 reps   Quad Sets Limitations 3" hold   Heel Slides 15 reps   Straight Leg Raises 10 reps   Manual Therapy   Manual Therapy Soft tissue mobilization;Joint mobilization   Joint Mobilization grade I-II patellar mobs in all planes to decrease pain   Soft tissue mobilization distal quads and scar                   PT Short Term Goals - 01/08/15 1608    PT SHORT TERM GOAL #1   Title I HEP    Time 2   Period Days   PT SHORT TERM GOAL #2   Title ROM to be 4-125 to allow more normal gait pattern   Time 2   Period Weeks   PT SHORT TERM GOAL #3   Title Pt to  be ambulationg for  60 minutes at a time without difficulty   Time 2   Period Weeks   PT SHORT TERM GOAL #4   Title Pain level to be no greater than a 2/10 80% of the day.   Time 2   Period Weeks           PT Long Term Goals - 01/08/15 1609    PT LONG TERM GOAL #1   Title I in advance HEP   Time 4   Period Weeks   PT LONG TERM GOAL #2   Title Pt strength to be at least a 4+/5 to allow him to go up and down steps holding onto one handrail in a reciprocal manner.   Time 4   Period Weeks   PT LONG TERM GOAL #3   Title Pt to state no pain on an average day    Time 4   Period Weeks   PT LONG TERM GOAL #4   Title Pt ROM to be 0 to 125 for normal gait   Time 4   Period Weeks   PT LONG TERM GOAL #5   Title Pt to be playing 9 holes of 3 par golf course   Time 6   Period Weeks               Plan - 01/15/15 1345    Clinical Impression Statement Functional strengthening was continued today. Pt reported increased pain and tenderness in lateral knee after completing supine  therex, and manual therapy was performed to distal quads and scar to improve soft tissue mobility and decrease pain. Pt was able to complete standing therex without increased pain following manual therapy, reported that his knee felt looser and it was more comfortable to walk post-treatment.    PT Next Visit Plan Begin SLS vectors, manual PRN        Problem List Patient Active Problem List   Diagnosis Date Noted  . Primary osteoarthritis of right knee 12/20/2014  . Primary osteoarthritis of knee   . Trigger finger of right hand 08/04/2013  . Arthritis of hand 08/04/2013  . Hypersalivation 09/27/2012  . Right sided abdominal pain 09/27/2012  . Headache, temporal 09/27/2012  . Weight loss 09/27/2012  . Hypertension 01/08/2012  . High cholesterol 01/08/2012  . Abnormal x-ray 01/08/2012    Hilma Favors, PT, DPT [phone] 01/15/2015, 3:41 PM  [facility] [address] [place], [place], [address] Phone: [phone]   Fax:  [phone]

## 2015-01-17 ENCOUNTER — Ambulatory Visit (HOSPITAL_COMMUNITY): Payer: Medicare Other | Admitting: Physical Therapy

## 2015-01-17 DIAGNOSIS — M25661 Stiffness of right knee, not elsewhere classified: Secondary | ICD-10-CM

## 2015-01-17 DIAGNOSIS — R29898 Other symptoms and signs involving the musculoskeletal system: Secondary | ICD-10-CM | POA: Diagnosis not present

## 2015-01-17 DIAGNOSIS — R262 Difficulty in walking, not elsewhere classified: Secondary | ICD-10-CM | POA: Diagnosis not present

## 2015-01-17 DIAGNOSIS — M25561 Pain in right knee: Secondary | ICD-10-CM | POA: Diagnosis not present

## 2015-01-17 DIAGNOSIS — R609 Edema, unspecified: Secondary | ICD-10-CM

## 2015-01-17 NOTE — Therapy (Signed)
[place], [place], [address] Phone: [phone]   Fax:  [phone]  Physical Therapy Treatment  Patient Details  Name: John Huffman MRN: [id number] Date of Birth: 10/28/1928 Referring Provider:  Carole Civil, MD  Encounter Date: 01/17/2015      PT End of Session - 01/17/15 1341    Visit Number 5   Number of Visits 18   Authorization Type medicare   Authorization - Visit Number 5   Authorization - Number of Visits 18   PT Start Time 1517   PT Stop Time 6160   PT Time Calculation (min) 43 min      Past Medical History  Diagnosis Date  . Hypertension   . High cholesterol   . Cancer     Proatate    Past Surgical History  Procedure Laterality Date  . Back surgery    . Appendectomy    . Vasectomy    . Total knee arthroplasty Right 12/20/2014    Procedure: TOTAL KNEE ARTHROPLASTY;  Surgeon: Carole Civil, MD;  Location: [facility];  Service: Orthopedics;  Laterality: Right;    There were no vitals filed for this visit.  Visit Diagnosis:  Knee stiffness, right  Weakness of right leg  Edema  Knee pain, acute, right  Difficulty walking down stairs      Subjective Assessment - 01/17/15 1300    Subjective PT states that he has no pain when he is sitting but immediate pain once he stands up   Currently in Pain? Yes   Pain Score 1    Pain Location Knee   Pain Orientation Right;Lateral   Pain Descriptors / Indicators Aching                         [facility] Adult PT Treatment/Exercise - 01/17/15 0001    Knee/Hip Exercises: Stretches   Active Hamstring Stretch Right;3 reps;30 seconds   Active Hamstring Stretch Limitations supine   Knee: Self-Stretch to increase Flexion Right;5 reps;20 seconds   Gastroc Stretch Both;3 reps;30 seconds   Knee/Hip Exercises: Standing   Heel Raises Both;15 reps   Heel Raises Limitations with functional squat   no hands    Knee Flexion  Strengthening;Right;10 reps   Knee Flexion Limitations 4#   Functional Squat 15 reps   Rocker Board 3 minutes   SLS with Vectors 10" x 3    Other Standing Knee Exercises --   Knee/Hip Exercises: Supine   Quad Sets 15 reps   Other Supine Knee/Hip Exercises 5-125   Knee/Hip Exercises: Sidelying   Hip ABduction Right;15 reps   Hip ABduction Limitations 3   Manual Therapy   Manual Therapy Edema management;Soft tissue mobilization                  PT Short Term Goals - 01/08/15 1608    PT SHORT TERM GOAL #1   Title I HEP    Time 2   Period Days   PT SHORT TERM GOAL #2   Title ROM to be 4-125 to allow more normal gait pattern   Time 2   Period Weeks   PT SHORT TERM GOAL #3   Title Pt to be ambulationg for  60 minutes at a time without difficulty   Time 2   Period Weeks   PT SHORT TERM GOAL #4   Title Pain level to be no greater than a 2/10 80%  of the day.   Time 2   Period Weeks           PT Long Term Goals - 01/08/15 1609    PT LONG TERM GOAL #1   Title I in advance HEP   Time 4   Period Weeks   PT LONG TERM GOAL #2   Title Pt strength to be at least a 4+/5 to allow him to go up and down steps holding onto one handrail in a reciprocal manner.   Time 4   Period Weeks   PT LONG TERM GOAL #3   Title Pt to state no pain on an average day    Time 4   Period Weeks   PT LONG TERM GOAL #4   Title Pt ROM to be 0 to 125 for normal gait   Time 4   Period Weeks   PT LONG TERM GOAL #5   Title Pt to be playing 9 holes of 3 par golf course   Time 6   Period Weeks               Plan - 01/17/15 1342    Clinical Impression Statement Pt ROM is continueing to improve. Pt main concern continues to be his pain with weight bearing and "Clicking" in knee.  Extra time was spent on manual to decrease edema and complaint of pain.    PT Next Visit Plan continue manual begin stairs        Problem List Patient Active Problem List   Diagnosis Date Noted  .  Primary osteoarthritis of right knee 12/20/2014  . Primary osteoarthritis of knee   . Trigger finger of right hand 08/04/2013  . Arthritis of hand 08/04/2013  . Hypersalivation 09/27/2012  . Right sided abdominal pain 09/27/2012  . Headache, temporal 09/27/2012  . Weight loss 09/27/2012  . Hypertension 01/08/2012  . High cholesterol 01/08/2012  . Abnormal x-ray 01/08/2012  Rayetta Humphrey, PT CLT [phone]  01/17/2015, 1:45 PM  [facility] [address] [place], [place], [address] Phone: [phone]   Fax:  [phone]

## 2015-01-19 ENCOUNTER — Ambulatory Visit (HOSPITAL_COMMUNITY): Payer: Medicare Other

## 2015-01-19 DIAGNOSIS — R609 Edema, unspecified: Secondary | ICD-10-CM | POA: Diagnosis not present

## 2015-01-19 DIAGNOSIS — R29898 Other symptoms and signs involving the musculoskeletal system: Secondary | ICD-10-CM | POA: Diagnosis not present

## 2015-01-19 DIAGNOSIS — M25561 Pain in right knee: Secondary | ICD-10-CM

## 2015-01-19 DIAGNOSIS — R262 Difficulty in walking, not elsewhere classified: Secondary | ICD-10-CM

## 2015-01-19 DIAGNOSIS — M25661 Stiffness of right knee, not elsewhere classified: Secondary | ICD-10-CM

## 2015-01-19 NOTE — Therapy (Signed)
[place], [place], [address] Phone: [phone]   Fax:  [phone]  Physical Therapy Treatment  Patient Details  Name: John Huffman MRN: [id number] Date of Birth: 05-08-1928 Referring Provider:  Carole Civil, MD  Encounter Date: 01/19/2015      PT End of Session - 01/19/15 1304    Visit Number 6   Number of Visits 18   Date for PT Re-Evaluation 02/07/15   Authorization Type medicare   Authorization - Visit Number 6   Authorization - Number of Visits 10   PT Start Time 1301   PT Stop Time 1346   PT Time Calculation (min) 45 min   Activity Tolerance Patient tolerated treatment well   Behavior During Therapy [facility] for tasks assessed/performed      Past Medical History  Diagnosis Date  . Hypertension   . High cholesterol   . Cancer     Proatate    Past Surgical History  Procedure Laterality Date  . Back surgery    . Appendectomy    . Vasectomy    . Total knee arthroplasty Right 12/20/2014    Procedure: TOTAL KNEE ARTHROPLASTY;  Surgeon: Carole Civil, MD;  Location: [facility];  Service: Orthopedics;  Laterality: Right;    There were no vitals filed for this visit.  Visit Diagnosis:  Knee stiffness, right  Weakness of right leg  Edema  Knee pain, acute, right  Difficulty walking down stairs      Subjective Assessment - 01/19/15 1301    Subjective Pt stated minimal pain today, biggest problem currently with the clicking during gait.   Currently in Pain? Yes   Pain Score 2    Pain Location Knee   Pain Orientation Right;Lateral   Pain Descriptors / Indicators Aching;Tightness;Sore             [facility] Adult PT Treatment/Exercise - 01/19/15 0001    Knee/Hip Exercises: Standing   Heel Raises Both;15 reps   Forward Lunges 10 reps   Forward Lunges Limitations 4in step   Side Lunges 10 reps   Side Lunges Limitations 4in step   Terminal Knee Extension Limitations 15x5" RTB   Lateral Step Up 15 reps;Hand Hold: 1;Step Height: 4"   Forward Step Up 10 reps;Hand Hold: 1;Step Height: 6"   Functional Squat 15 reps   Stairs 3 reps reciprocal pattern 4in height   Knee/Hip Exercises: Supine   Terminal Knee Extension 15 reps;Theraband   Theraband Level (Terminal Knee Extension) Level 2 (Red)   Terminal Knee Extension Limitations 5" holds   Manual Therapy   Manual Therapy Edema management;Joint mobilization;Soft tissue mobilization   Edema Management Retro massage with STM to quad   Joint Mobilization grade I-II patellar mobs in all planes to decrease pain   Soft tissue mobilization distal quads and scar            PT Short Term Goals - 01/08/15 1608    PT SHORT TERM GOAL #1   Title I HEP    Time 2   Period Days   PT SHORT TERM GOAL #2   Title ROM to be 4-125 to allow more normal gait pattern   Time 2   Period Weeks   PT SHORT TERM GOAL #3   Title Pt to be ambulationg for  60 minutes at a time without difficulty   Time 2   Period Weeks   PT SHORT TERM GOAL #4   Title  Pain level to be no greater than a 2/10 80% of the day.   Time 2   Period Weeks           PT Long Term Goals - 01/08/15 1609    PT LONG TERM GOAL #1   Title I in advance HEP   Time 4   Period Weeks   PT LONG TERM GOAL #2   Title Pt strength to be at least a 4+/5 to allow him to go up and down steps holding onto one handrail in a reciprocal manner.   Time 4   Period Weeks   PT LONG TERM GOAL #3   Title Pt to state no pain on an average day    Time 4   Period Weeks   PT LONG TERM GOAL #4   Title Pt ROM to be 0 to 125 for normal gait   Time 4   Period Weeks   PT LONG TERM GOAL #5   Title Pt to be playing 9 holes of 3 par golf course   Time 6   Period Weeks               Plan - 01/19/15 1316    Clinical Impression Statement Added TKE for quad strengthening to improve knee extension.  Progressed functional strengthening with lower height with lunges for increased  weight loading and increased height for step up for quad strengtheing.  Began stair training on 4in with instructions for reciprocal pattern.  Pt able to complete all exericses with good form following min cueing.  Continued with manual retro massage for edema control and joint mobs to improve patella mobility..  Pt stated he was pain free at end of session with improve gait mechanics.     PT Next Visit Plan continue manual;  Begin step down and increase height with stair training ascending next session.  Resume balance training next session.          Problem List Patient Active Problem List   Diagnosis Date Noted  . Primary osteoarthritis of right knee 12/20/2014  . Primary osteoarthritis of knee   . Trigger finger of right hand 08/04/2013  . Arthritis of hand 08/04/2013  . Hypersalivation 09/27/2012  . Right sided abdominal pain 09/27/2012  . Headache, temporal 09/27/2012  . Weight loss 09/27/2012  . Hypertension 01/08/2012  . High cholesterol 01/08/2012  . Abnormal x-ray 01/08/2012   John Huffman, [place]; [place]  Aldona Lento 01/19/2015, 3:27 PM  [place] [address] [place], [place], [address] Phone: [phone]   Fax:  [phone]

## 2015-01-22 ENCOUNTER — Ambulatory Visit (HOSPITAL_COMMUNITY): Payer: Medicare Other | Attending: Orthopedic Surgery | Admitting: Physical Therapy

## 2015-01-22 DIAGNOSIS — R29898 Other symptoms and signs involving the musculoskeletal system: Secondary | ICD-10-CM | POA: Diagnosis not present

## 2015-01-22 DIAGNOSIS — M25561 Pain in right knee: Secondary | ICD-10-CM | POA: Diagnosis not present

## 2015-01-22 DIAGNOSIS — R262 Difficulty in walking, not elsewhere classified: Secondary | ICD-10-CM | POA: Insufficient documentation

## 2015-01-22 DIAGNOSIS — R600 Localized edema: Secondary | ICD-10-CM | POA: Insufficient documentation

## 2015-01-22 DIAGNOSIS — M25661 Stiffness of right knee, not elsewhere classified: Secondary | ICD-10-CM | POA: Diagnosis not present

## 2015-01-22 NOTE — Therapy (Signed)
[place], [place], [address] Phone: [phone]   Fax:  [phone]  Physical Therapy Treatment  Patient Details  Name: John Huffman MRN: [id number] Date of Birth: January 18, 1929 Referring Provider:  Asencion Noble, MD  Encounter Date: 01/22/2015      PT End of Session - 01/22/15 1700    Visit Number 7   Number of Visits 18   Date for PT Re-Evaluation 02/07/15   Authorization Type medicare   Authorization - Visit Number 7   Authorization - Number of Visits 10   PT Start Time 1600   PT Stop Time 1655   PT Time Calculation (min) 55 min   Activity Tolerance Patient tolerated treatment well   Behavior During Therapy [facility] for tasks assessed/performed      Past Medical History  Diagnosis Date  . Hypertension   . High cholesterol   . Cancer     Proatate    Past Surgical History  Procedure Laterality Date  . Back surgery    . Appendectomy    . Vasectomy    . Total knee arthroplasty Right 12/20/2014    Procedure: TOTAL KNEE ARTHROPLASTY;  Surgeon: Carole Civil, MD;  Location: [facility];  Service: Orthopedics;  Laterality: Right;    There were no vitals filed for this visit.  Visit Diagnosis:  Knee stiffness, right  Weakness of right leg  Knee pain, acute, right  Difficulty walking down stairs      Subjective Assessment - 01/22/15 1705    Subjective Pt states main pain is with ambulation and the clicking in his knee that he hopes goes away.    Currently in Pain? Yes   Pain Score 2    Pain Location Knee   Pain Orientation Right                         [facility] Adult PT Treatment/Exercise - 01/22/15 1604    Knee/Hip Exercises: Stretches   Active Hamstring Stretch 3 reps;30 seconds   Active Hamstring Stretch Limitations 14" step   Knee: Self-Stretch to increase Flexion 10 seconds   Knee: Self-Stretch Limitations 10 reps at 12" step   Gastroc Stretch Both;3 reps;30 seconds   Gastroc  Stretch Limitations slant board    Knee/Hip Exercises: Standing   Heel Raises Both;15 reps   Heel Raises Limitations with functional squat    Forward Lunges 10 reps   Forward Lunges Limitations 4in step   Side Lunges 10 reps   Side Lunges Limitations 4in step   Terminal Knee Extension Limitations 15 reps RTB   Lateral Step Up 10 reps;Hand Hold: 1;Step Height: 4"   Forward Step Up 10 reps;Hand Hold: 1;Step Height: 6"   Functional Squat 15 reps   Stairs 2RT 7" reciprocally with 1 HR   SLS with Vectors 10" x 3    Knee/Hip Exercises: Sidelying   Hip ABduction Right;10 reps;2 sets   Hip ABduction Limitations 3   Manual Therapy   Manual Therapy Edema management;Joint mobilization;Soft tissue mobilization   Edema Management Retro massage with STM to quad   Joint Mobilization grade I-II patellar mobs in all planes to decrease pain   Soft tissue mobilization distal quads and scar                   PT Short Term Goals - 01/08/15 1608    PT SHORT TERM GOAL #1   Title I HEP  Time 2   Period Days   PT SHORT TERM GOAL #2   Title ROM to be 4-125 to allow more normal gait pattern   Time 2   Period Weeks   PT SHORT TERM GOAL #3   Title Pt to be ambulationg for  60 minutes at a time without difficulty   Time 2   Period Weeks   PT SHORT TERM GOAL #4   Title Pain level to be no greater than a 2/10 80% of the day.   Time 2   Period Weeks           PT Long Term Goals - 01/08/15 1609    PT LONG TERM GOAL #1   Title I in advance HEP   Time 4   Period Weeks   PT LONG TERM GOAL #2   Title Pt strength to be at least a 4+/5 to allow him to go up and down steps holding onto one handrail in a reciprocal manner.   Time 4   Period Weeks   PT LONG TERM GOAL #3   Title Pt to state no pain on an average day    Time 4   Period Weeks   PT LONG TERM GOAL #4   Title Pt ROM to be 0 to 125 for normal gait   Time 4   Period Weeks   PT LONG TERM GOAL #5   Title Pt to be playing 9  holes of 3 par golf course   Time 6   Period Weeks               Plan - 01/22/15 1701    Clinical Impression Statement Exercise instructed by Hilma Favors from [phone].  Pt progressing well with therex.  Tactile and verbal cues needed for posture/form with vector stance.  Progressed to 7" reciprocal steps with good control and form.  Continued with manual to decrease edeam and improve moblity.  Pt without pain at end of session.    PT Next Visit Plan continue manual;    Resume balance training next session.          Problem List Patient Active Problem List   Diagnosis Date Noted  . Primary osteoarthritis of right knee 12/20/2014  . Primary osteoarthritis of knee   . Trigger finger of right hand 08/04/2013  . Arthritis of hand 08/04/2013  . Hypersalivation 09/27/2012  . Right sided abdominal pain 09/27/2012  . Headache, temporal 09/27/2012  . Weight loss 09/27/2012  . Hypertension 01/08/2012  . High cholesterol 01/08/2012  . Abnormal x-ray 01/08/2012    Teena Irani, PTA/CLT [phone]  01/22/2015, 5:06 PM  [place], [place], [address] Phone: [phone]   Fax:  [phone]

## 2015-01-24 ENCOUNTER — Ambulatory Visit (HOSPITAL_COMMUNITY): Payer: Medicare Other | Admitting: Physical Therapy

## 2015-01-24 DIAGNOSIS — M25561 Pain in right knee: Secondary | ICD-10-CM | POA: Diagnosis not present

## 2015-01-24 DIAGNOSIS — R262 Difficulty in walking, not elsewhere classified: Secondary | ICD-10-CM

## 2015-01-24 DIAGNOSIS — R29898 Other symptoms and signs involving the musculoskeletal system: Secondary | ICD-10-CM | POA: Diagnosis not present

## 2015-01-24 DIAGNOSIS — M25661 Stiffness of right knee, not elsewhere classified: Secondary | ICD-10-CM

## 2015-01-24 DIAGNOSIS — R600 Localized edema: Secondary | ICD-10-CM | POA: Diagnosis not present

## 2015-01-24 NOTE — Therapy (Signed)
[place], [place], [address] Phone: [phone]   Fax:  [phone]  Physical Therapy Treatment  Patient Details  Name: John Huffman MRN: [id number] Date of Birth: Aug 24, 1928 Referring Provider:  Carole Civil, MD  Encounter Date: 01/24/2015      PT End of Session - 01/24/15 1349    Visit Number 8   Number of Visits 18   Authorization Type medicare   Authorization - Visit Number 8   Authorization - Number of Visits 10   PT Start Time 6301   PT Stop Time 1350   PT Time Calculation (min) 48 min   Activity Tolerance Patient tolerated treatment well      Past Medical History  Diagnosis Date  . Hypertension   . High cholesterol   . Cancer     Proatate    Past Surgical History  Procedure Laterality Date  . Back surgery    . Appendectomy    . Vasectomy    . Total knee arthroplasty Right 12/20/2014    Procedure: TOTAL KNEE ARTHROPLASTY;  Surgeon: Carole Civil, MD;  Location: [facility];  Service: Orthopedics;  Laterality: Right;    There were no vitals filed for this visit.  Visit Diagnosis:  Knee stiffness, right  Weakness of right leg  Knee pain, acute, right  Difficulty walking down stairs      Subjective Assessment - 01/24/15 1301    Subjective Pt contnues to have pain when he walks but overall he is doing better.    Pain Score 3                          [facility] Adult PT Treatment/Exercise - 01/24/15 0001    Knee/Hip Exercises: Stretches   Active Hamstring Stretch 3 reps;30 seconds   Active Hamstring Stretch Limitations 14" step   Knee: Self-Stretch to increase Flexion Right;3 reps;30 seconds   Gastroc Stretch Both;60 seconds   Knee/Hip Exercises: Standing   Heel Raises 15 reps   Knee Flexion Strengthening;Right;10 reps   Forward Lunges Right;10 reps   Forward Lunges Limitations 6" step   Side Lunges Both;10 reps   Terminal Knee Extension Limitations 15 reps  RTB   Lateral Step Up 10 reps;Hand Hold: 1;Step Height: 4"   Forward Step Up 10 reps;Hand Hold: 1;Step Height: 6"   Functional Squat 15 reps   Rocker Board 2 minutes   SLS with Vectors 10" x 3    Knee/Hip Exercises: Seated   Sit to Sand 5 reps  60 cm    Manual Therapy   Manual Therapy Edema management;Joint mobilization;Soft tissue mobilization   Edema Management Retro massage with STM to quad   Soft tissue mobilization distal quads and scar                   PT Short Term Goals - 01/08/15 1608    PT SHORT TERM GOAL #1   Title I HEP    Time 2   Period Days   PT SHORT TERM GOAL #2   Title ROM to be 4-125 to allow more normal gait pattern   Time 2   Period Weeks   PT SHORT TERM GOAL #3   Title Pt to be ambulationg for  60 minutes at a time without difficulty   Time 2   Period Weeks   PT SHORT TERM GOAL #4   Title Pain level to be  no greater than a 2/10 80% of the day.   Time 2   Period Weeks           PT Long Term Goals - 01/08/15 1609    PT LONG TERM GOAL #1   Title I in advance HEP   Time 4   Period Weeks   PT LONG TERM GOAL #2   Title Pt strength to be at least a 4+/5 to allow him to go up and down steps holding onto one handrail in a reciprocal manner.   Time 4   Period Weeks   PT LONG TERM GOAL #3   Title Pt to state no pain on an average day    Time 4   Period Weeks   PT LONG TERM GOAL #4   Title Pt ROM to be 0 to 125 for normal gait   Time 4   Period Weeks   PT LONG TERM GOAL #5   Title Pt to be playing 9 holes of 3 par golf course   Time 6   Period Weeks               Plan - 01/24/15 1350    Clinical Impression Statement Pt progressing well in all respects.  Verbal cuing to keep core tight when completing exercises.  Pt has noted mm spasm along lateral distal quad which was addressed during soft tissue mobilization.     PT Next Visit Plan continue to focuse on manual and strenghtening         Problem List Patient Active  Problem List   Diagnosis Date Noted  . Primary osteoarthritis of right knee 12/20/2014  . Primary osteoarthritis of knee   . Trigger finger of right hand 08/04/2013  . Arthritis of hand 08/04/2013  . Hypersalivation 09/27/2012  . Right sided abdominal pain 09/27/2012  . Headache, temporal 09/27/2012  . Weight loss 09/27/2012  . Hypertension 01/08/2012  . High cholesterol 01/08/2012  . Abnormal x-ray 01/08/2012   Rayetta Humphrey, PT CLT [phone] 01/24/2015, 1:53 PM  [facility] [place] [address] [place], [place], [address] Phone: [phone]   Fax:  [phone]

## 2015-01-26 ENCOUNTER — Ambulatory Visit (HOSPITAL_COMMUNITY): Payer: Medicare Other | Admitting: Physical Therapy

## 2015-01-26 DIAGNOSIS — R600 Localized edema: Secondary | ICD-10-CM | POA: Diagnosis not present

## 2015-01-26 DIAGNOSIS — M25561 Pain in right knee: Secondary | ICD-10-CM

## 2015-01-26 DIAGNOSIS — R29898 Other symptoms and signs involving the musculoskeletal system: Secondary | ICD-10-CM | POA: Diagnosis not present

## 2015-01-26 DIAGNOSIS — R262 Difficulty in walking, not elsewhere classified: Secondary | ICD-10-CM | POA: Diagnosis not present

## 2015-01-26 DIAGNOSIS — M25661 Stiffness of right knee, not elsewhere classified: Secondary | ICD-10-CM

## 2015-01-26 NOTE — Therapy (Signed)
[place], [place], [address] Phone: [phone]   Fax:  [phone]  Physical Therapy Treatment  Patient Details  Name: John Huffman MRN: [id number] Date of Birth: [birth year] Referring Provider:  Carole Civil, MD  Encounter Date: 01/26/2015      PT End of Session - 01/26/15 1330    Visit Number 9   Number of Visits 18   Authorization - Visit Number 9   Authorization - Number of Visits 10   PT Start Time 1324   PT Stop Time 4010   PT Time Calculation (min) 46 min      Past Medical History  Diagnosis Date  . Hypertension   . High cholesterol   . Cancer     Proatate    Past Surgical History  Procedure Laterality Date  . Back surgery    . Appendectomy    . Vasectomy    . Total knee arthroplasty Right 12/20/2014    Procedure: TOTAL KNEE ARTHROPLASTY;  Surgeon: Carole Civil, MD;  Location: [facility];  Service: Orthopedics;  Laterality: Right;    There were no vitals filed for this visit.  Visit Diagnosis:  Knee stiffness, right  Weakness of right leg  Knee pain, acute, right  Localized edema           [facility] Adult PT Treatment/Exercise - 01/26/15 0001    Knee/Hip Exercises: Stretches   Active Hamstring Stretch Right;3 reps;30 seconds   Active Hamstring Stretch Limitations supine   Passive Hamstring Stretch Right;3 reps;30 seconds   Passive Hamstring Stretch Limitations 3rd step    Knee: Self-Stretch to increase Flexion Right;5 reps;10 seconds   Knee/Hip Exercises: Standing   Heel Raises Both;15 reps   Terminal Knee Extension Limitations 15 reps RTB   Lateral Step Up Right;10 reps   Lateral Step Up Limitations 7" step    Forward Step Up 10 reps;Hand Hold: 1;Step Height: 6"   Forward Step Up Limitations 7" step    Stairs 5 rt 7"   SLS with Vectors 15" x 3   Knee/Hip Exercises: Seated   Sit to Sand --  7x   Knee/Hip Exercises: Supine   Quad Sets 10 reps   Other Supine  Knee/Hip Exercises 3-126   Manual Therapy   Manual Therapy Edema management;Joint mobilization;Soft tissue mobilization   Edema Management Retro massage with STM to quad   Soft tissue mobilization distal quads and scar                   PT Short Term Goals - 01/08/15 1608    PT SHORT TERM GOAL #1   Title I HEP    Time 2   Period Days   PT SHORT TERM GOAL #2   Title ROM to be 4-125 to allow more normal gait pattern   Time 2   Period Weeks   PT SHORT TERM GOAL #3   Title Pt to be ambulationg for  60 minutes at a time without difficulty   Time 2   Period Weeks   PT SHORT TERM GOAL #4   Title Pain level to be no greater than a 2/10 80% of the day.   Time 2   Period Weeks           PT Long Term Goals - 01/08/15 1609    PT LONG TERM GOAL #1   Title I in advance HEP   Time 4   Period Weeks  PT LONG TERM GOAL #2   Title Pt strength to be at least a 4+/5 to allow John Huffman to go up and down steps holding onto one handrail in a reciprocal manner.   Time 4   Period Weeks   PT LONG TERM GOAL #3   Title Pt to state no pain on an average day    Time 4   Period Weeks   PT LONG TERM GOAL #4   Title Pt ROM to be 0 to 125 for normal gait   Time 4   Period Weeks   PT LONG TERM GOAL #5   Title Pt to be playing 9 holes of 3 par golf course   Time 6   Period Weeks               Plan - 01/26/15 1331    Clinical Impression Statement Pt continues to have some swelling but reasonable for as active as he has been.  ROM 3-126.  Continues with manual to decrese spasm of distal quad.    PT Next Visit Plan G: code due next treatment.  Continue to focus on manual and strenghtening         Problem List Patient Active Problem List   Diagnosis Date Noted  . Primary osteoarthritis of right knee 12/20/2014  . Primary osteoarthritis of knee   . Trigger finger of right hand 08/04/2013  . Arthritis of hand 08/04/2013  . Hypersalivation 09/27/2012  . Right sided abdominal  pain 09/27/2012  . Headache, temporal 09/27/2012  . Weight loss 09/27/2012  . Hypertension 01/08/2012  . High cholesterol 01/08/2012  . Abnormal x-ray 01/08/2012   Rayetta Humphrey, PT CLT [phone]  01/26/2015, 1:43 PM  [facility] [address] [place], [place], [address] Phone: [phone]   Fax:  [phone]

## 2015-01-29 ENCOUNTER — Ambulatory Visit (HOSPITAL_COMMUNITY): Payer: Medicare Other | Admitting: Physical Therapy

## 2015-01-29 DIAGNOSIS — M25561 Pain in right knee: Secondary | ICD-10-CM

## 2015-01-29 DIAGNOSIS — C61 Malignant neoplasm of prostate: Secondary | ICD-10-CM | POA: Diagnosis not present

## 2015-01-29 DIAGNOSIS — R29898 Other symptoms and signs involving the musculoskeletal system: Secondary | ICD-10-CM | POA: Diagnosis not present

## 2015-01-29 DIAGNOSIS — R600 Localized edema: Secondary | ICD-10-CM

## 2015-01-29 DIAGNOSIS — R972 Elevated prostate specific antigen [PSA]: Secondary | ICD-10-CM | POA: Diagnosis not present

## 2015-01-29 DIAGNOSIS — R262 Difficulty in walking, not elsewhere classified: Secondary | ICD-10-CM | POA: Diagnosis not present

## 2015-01-29 DIAGNOSIS — M25661 Stiffness of right knee, not elsewhere classified: Secondary | ICD-10-CM | POA: Diagnosis not present

## 2015-01-29 NOTE — Therapy (Addendum)
[facility] [place], [place], [address] Phone: [phone]   Fax:  [phone]  Physical Therapy Treatment  Patient Details  Name: John Huffman MRN: [id number] Date of Birth: 05-Jul-1928 Referring Provider:  Carole Civil, MD  Encounter Date: 01/29/2015      PT End of Session - 01/29/15 1435    Visit Number 10   Number of Visits 18   Date for PT Re-Evaluation 02/07/15   Authorization Type medicare   Authorization - Visit Number 10   Authorization - Number of Visits 20   PT Start Time 0539   PT Stop Time 1435   PT Time Calculation (min) 50 min      Past Medical History  Diagnosis Date  . Hypertension   . High cholesterol   . Cancer     Proatate    Past Surgical History  Procedure Laterality Date  . Back surgery    . Appendectomy    . Vasectomy    . Total knee arthroplasty Right 12/20/2014    Procedure: TOTAL KNEE ARTHROPLASTY;  Surgeon: Carole Civil, MD;  Location: [facility];  Service: Orthopedics;  Laterality: Right;    There were no vitals filed for this visit.  Visit Diagnosis:  Knee stiffness, right  Weakness of right leg  Knee pain, acute, right  Localized edema  Difficulty walking down stairs      Subjective Assessment - 01/29/15 1346    Subjective Pt states he feels he is improving.    Currently in Pain? Yes   Pain Score 2             [facility] PT Assessment - 01/29/15 0001    Assessment   Medical Diagnosis Rt TKR   Onset Date/Surgical Date 12/20/14   Hand Dominance Right   Next MD Visit 01/22/2015   Prior Therapy HH   Precautions   Precautions None   Required Braces or Orthoses --  none   Restrictions   Weight Bearing Restrictions No   Prior Function   Level of Independence Independent   Vocation Retired   Leisure Enjoys golfing, walking.    Cognition   Overall Cognitive Status Within Functional Limits for tasks assessed   Observation/Other Assessments   Focus  on Therapeutic Outcomes (FOTO)  --   Functional Tests   Functional tests Single leg stance   Single Leg Stance   Comments Lt 20 seconds ; Rt 30    AROM   Right Knee Extension 2   Right Knee Flexion 126   Strength   Right Hip Flexion 5/5   Right Hip Extension 3+/5  was 3+/5   Right Hip ABduction 4-/5  was 3+/5    Right Knee Flexion 4+/5  was 4/5   Right Knee Extension 5/5   Right Ankle Dorsiflexion 5/5              [facility] Adult PT Treatment/Exercise - 01/29/15 0001    Knee/Hip Exercises: Stretches   Active Hamstring Stretch Right;3 reps;30 seconds   Active Hamstring Stretch Limitations supine   Knee: Self-Stretch to increase Flexion Right;5 reps;10 seconds   Gastroc Stretch Both;3 reps;30 seconds   Gastroc Stretch Limitations slant board    Knee/Hip Exercises: Standing   Heel Raises Both;15 reps   Knee Flexion Strengthening;Right;10 reps   Knee Flexion Limitations 5#    Terminal Knee Extension Limitations 15 reps RTB   Lateral Step Up Right;10 reps;Step Height: 6"   Forward Step  Up 10 reps;Hand Hold: 1;Step Height: 6"   Stairs 5 rt 7"   Rocker Board 2 minutes   SLS with Vectors 15" x 3   Knee/Hip Exercises: Seated   Sit to Sand --  7x   Knee/Hip Exercises: Supine   Quad Sets 10 reps   Heel Slides 5 reps   Terminal Knee Extension 10 reps   Other Supine Knee/Hip Exercises 2-126   Manual Therapy   Manual Therapy Edema management;Joint mobilization;Soft tissue mobilization   Edema Management Retro massage with STM to quad   Soft tissue mobilization distal quads and scar       Manual was done separately from any other treatment during this session.          PT Education - 01/29/15 1436    Education provided Yes   Education Details Pt has not been doing his sidelying hip abduction or prone hip extension stressed the importance of doing these exercises.  Pt encouraged to start 1/2 golf swings for 15 minutes.    Person(s) Educated Patient   Methods  Explanation   Comprehension Verbalized understanding;Returned demonstration          PT Short Term Goals - 01/29/15 1431    PT SHORT TERM GOAL #1   Title I HEP    Time 2   Period Weeks   Status Achieved   PT SHORT TERM GOAL #2   Title ROM to be 4-125 to allow more normal gait pattern   Time 2   Period Weeks   Status Achieved   PT SHORT TERM GOAL #3   Title Pt to be ambulationg for  60 minutes at a time without difficulty   Time 2   Period Weeks   Status On-going   PT SHORT TERM GOAL #4   Title Pain level to be no greater than a 2/10 80% of the day.   Time 2   Period Weeks   Status Achieved           PT Long Term Goals - 01/29/15 1431    PT LONG TERM GOAL #1   Title I in advance HEP   Time 4   Period Weeks   Status Achieved   PT LONG TERM GOAL #2   Title Pt strength to be at least a 4+/5 to allow him to go up and down steps holding onto one handrail in a reciprocal manner.   Time 4   Period Weeks   Status On-going   PT LONG TERM GOAL #3   Title Pt to state no pain on an average day    Time 4   Period Weeks   Status On-going   PT LONG TERM GOAL #4   Title Pt ROM to be 0 to 125 for normal gait   Time 4   Period Weeks   Status On-going   PT LONG TERM GOAL #5   Title Pt to be playing 9 holes of 3 par golf course   Time 6   Period Weeks   Status On-going               Plan - 01/29/15 1437    Clinical Impression Statement Pt reassessed today.  Pt ROM 2-126; strength has improved for all but hip extensors.  Encouraged to complete sidelying abduction and prone extension at home.  Pt continues to demonstrate improved quad control with minimal cuing needed for proper technique of exercise.    Pt will benefit from skilled therapeutic  intervention in order to improve on the following deficits Decreased activity tolerance;Decreased balance;Decreased range of motion;Decreased strength;Difficulty walking;Increased edema;Increased fascial restricitons;Pain   PT  Frequency 3x / week   PT Duration 6 weeks   PT Treatment/Interventions ADLs/Self Care Home Management;Stair training;Functional mobility training;Therapeutic activities;Therapeutic exercise;Balance training;Manual techniques;Patient/family education;Scar mobilization;Passive range of motion   PT Next Visit Plan continue to focuse on manual and strenghtening complete           G-Codes - 02-07-15 1439    Functional Assessment Tool Used functional status; clinical judgement.    Functional Limitation Mobility: Walking and moving around   Mobility: Walking and Moving Around Current Status [phone]) At least 20 percent but less than 40 percent impaired, limited or restricted   Mobility: Walking and Moving Around Goal Status [phone]) At least 20 percent but less than 40 percent impaired, limited or restricted      Problem List Patient Active Problem List   Diagnosis Date Noted  . Primary osteoarthritis of right knee 12/20/2014  . Primary osteoarthritis of knee   . Trigger finger of right hand 08/04/2013  . Arthritis of hand 08/04/2013  . Hypersalivation 09/27/2012  . Right sided abdominal pain 09/27/2012  . Headache, temporal 09/27/2012  . Weight loss 09/27/2012  . Hypertension 01/08/2012  . High cholesterol 01/08/2012  . Abnormal x-ray 01/08/2012     Physical Therapy Progress Note  Dates of Reporting Period:01/08/2015 to 02-07-2015  Objective Reports of Subjective Statement: Pt states that he can tell that he is getting better.  Objective Measurements: see above   Goal Update:see above   Plan: focus on gluteal medius and maximus mm strength.   Reason Skilled Services are Required: maximize functional ability; Pt told to start hitting golf balls by therapist   Rayetta Humphrey, PT CLT [id number] Feb 07, 2015, 2:41 PM  [facility] [address] [place], [place], [address] Phone: [phone]   Fax:  [phone]

## 2015-01-30 ENCOUNTER — Ambulatory Visit (INDEPENDENT_AMBULATORY_CARE_PROVIDER_SITE_OTHER): Payer: Self-pay | Admitting: Orthopedic Surgery

## 2015-01-30 VITALS — BP 88/57 | Ht 72.0 in | Wt 180.0 lb

## 2015-01-30 DIAGNOSIS — Z4789 Encounter for other orthopedic aftercare: Secondary | ICD-10-CM

## 2015-01-30 DIAGNOSIS — Z96651 Presence of right artificial knee joint: Secondary | ICD-10-CM

## 2015-01-30 NOTE — Progress Notes (Signed)
Patient ID: John Huffman, male   DOB: 1928/05/15, 81 y.o.   MRN: [id number]  Follow up visit  Chief Complaint  Patient presents with  . Follow-up    post op 2, Right total knee replacement, DOS 12/20/14    BP 88/57 mmHg  Ht 6' (1.829 m)  Wt 180 lb (81.647 kg)  BMI 24.41 kg/m2  No diagnosis found.  Six-week follow-up right total knee patient is without pain medicine or assistive device. Knee flexion is at least 120 he may have hinted to flexion contracture. Incision clean dry intact no erythema  Recommend continue physical therapy and see me in 6 weeks

## 2015-01-31 ENCOUNTER — Ambulatory Visit (HOSPITAL_COMMUNITY): Payer: Medicare Other | Admitting: Physical Therapy

## 2015-01-31 DIAGNOSIS — R29898 Other symptoms and signs involving the musculoskeletal system: Secondary | ICD-10-CM

## 2015-01-31 DIAGNOSIS — M25561 Pain in right knee: Secondary | ICD-10-CM

## 2015-01-31 DIAGNOSIS — M25661 Stiffness of right knee, not elsewhere classified: Secondary | ICD-10-CM | POA: Diagnosis not present

## 2015-01-31 DIAGNOSIS — R600 Localized edema: Secondary | ICD-10-CM | POA: Diagnosis not present

## 2015-01-31 DIAGNOSIS — R262 Difficulty in walking, not elsewhere classified: Secondary | ICD-10-CM | POA: Diagnosis not present

## 2015-01-31 NOTE — Therapy (Signed)
[facility] [place], [place], [address] Phone: [phone]   Fax:  [phone]  Physical Therapy Treatment  Patient Details  Name: John Huffman MRN: [id number] Date of Birth: Jan 27, 1929 Referring Provider:  Carole Civil, MD  Encounter Date: 01/31/2015      PT End of Session - 01/31/15 1421    Visit Number 11   Number of Visits 18   Date for PT Re-Evaluation 02/07/15   Authorization Type medicare   Authorization - Visit Number 11   Authorization - Number of Visits 12   PT Start Time 6962   Activity Tolerance Patient tolerated treatment well      Past Medical History  Diagnosis Date  . Hypertension   . High cholesterol   . Cancer     Proatate    Past Surgical History  Procedure Laterality Date  . Back surgery    . Appendectomy    . Vasectomy    . Total knee arthroplasty Right 12/20/2014    Procedure: TOTAL KNEE ARTHROPLASTY;  Surgeon: Carole Civil, MD;  Location: [facility];  Service: Orthopedics;  Laterality: Right;    There were no vitals filed for this visit.  Visit Diagnosis:  Knee stiffness, right  Weakness of right leg  Knee pain, acute, right  Localized edema      Subjective Assessment - 01/31/15 1354    Subjective Pt states that MD was pleased    Currently in Pain? Yes   Pain Score 1    Pain Location Knee   Pain Orientation Right                         [facility] Adult PT Treatment/Exercise - 01/31/15 0001    Knee/Hip Exercises: Stretches   Active Hamstring Stretch Right;3 reps;30 seconds   Active Hamstring Stretch Limitations 18" step   Knee: Self-Stretch to increase Flexion Right;5 reps;10 seconds   Gastroc Stretch Both;3 reps;30 seconds   Gastroc Stretch Limitations slant board    Knee/Hip Exercises: Standing   Forward Lunges Right;10 reps   Forward Lunges Limitations Bosu   Side Lunges Left;10 reps   Terminal Knee Extension Limitations 15 x   manual  with soccer and small blue ball 10 x @    Lateral Step Up Right;5 reps;Step Height: 6"   Forward Step Up Right;5 reps;Step Height: 6"   Functional Squat 10 reps   Stairs 5 rt 7"   Rocker Board 2 minutes   Knee/Hip Exercises: Supine   Other Supine Knee/Hip Exercises 2-126   Manual Therapy   Manual Therapy Edema management;Joint mobilization;Soft tissue mobilization   Soft tissue mobilization distal quads and scar                   PT Short Term Goals - 01/29/15 1431    PT SHORT TERM GOAL #1   Title I HEP    Time 2   Period Weeks   Status Achieved   PT SHORT TERM GOAL #2   Title ROM to be 4-125 to allow more normal gait pattern   Time 2   Period Weeks   Status Achieved   PT SHORT TERM GOAL #3   Title Pt to be ambulationg for  60 minutes at a time without difficulty   Time 2   Period Weeks   Status On-going   PT SHORT TERM GOAL #4   Title Pain level to be no greater  than a 2/10 80% of the day.   Time 2   Period Weeks   Status Achieved           PT Long Term Goals - 01/29/15 1431    PT LONG TERM GOAL #1   Title I in advance HEP   Time 4   Period Weeks   Status Achieved   PT LONG TERM GOAL #2   Title Pt strength to be at least a 4+/5 to allow him to go up and down steps holding onto one handrail in a reciprocal manner.   Time 4   Period Weeks   Status On-going   PT LONG TERM GOAL #3   Title Pt to state no pain on an average day    Time 4   Period Weeks   Status On-going   PT LONG TERM GOAL #4   Title Pt ROM to be 0 to 125 for normal gait   Time 4   Period Weeks   Status On-going   PT LONG TERM GOAL #5   Title Pt to be playing 9 holes of 3 par golf course   Time 6   Period Weeks   Status On-going               Plan - 01/31/15 1432    Clinical Impression Statement Pt feels he will be ready for discharge on Monday.  Treatment to focus on terminal extension as well as glut medius and maximus strength.     PT Next Visit Plan add side step  with t-band as well as sit to stand         Problem List Patient Active Problem List   Diagnosis Date Noted  . Primary osteoarthritis of right knee 12/20/2014  . Primary osteoarthritis of knee   . Trigger finger of right hand 08/04/2013  . Arthritis of hand 08/04/2013  . Hypersalivation 09/27/2012  . Right sided abdominal pain 09/27/2012  . Headache, temporal 09/27/2012  . Weight loss 09/27/2012  . Hypertension 01/08/2012  . High cholesterol 01/08/2012  . Abnormal x-ray 01/08/2012   Rayetta Humphrey, PT CLT [id number] 01/31/2015, 2:34 PM  [facility] [address] [place], [place], [address] Phone: [phone]   Fax:  [phone]

## 2015-02-02 ENCOUNTER — Ambulatory Visit (HOSPITAL_COMMUNITY): Payer: Medicare Other | Admitting: Physical Therapy

## 2015-02-02 DIAGNOSIS — R29898 Other symptoms and signs involving the musculoskeletal system: Secondary | ICD-10-CM

## 2015-02-02 DIAGNOSIS — R600 Localized edema: Secondary | ICD-10-CM | POA: Diagnosis not present

## 2015-02-02 DIAGNOSIS — R262 Difficulty in walking, not elsewhere classified: Secondary | ICD-10-CM

## 2015-02-02 DIAGNOSIS — M25561 Pain in right knee: Secondary | ICD-10-CM | POA: Diagnosis not present

## 2015-02-02 DIAGNOSIS — M25661 Stiffness of right knee, not elsewhere classified: Secondary | ICD-10-CM | POA: Diagnosis not present

## 2015-02-02 NOTE — Therapy (Signed)
[facility] [address] [place], [place], [address] Phone: [phone]   Fax:  [phone]  Physical Therapy Treatment  Patient Details  Name: John Huffman MRN: [id number] Date of Birth: 1929/02/13 No Data Recorded  Encounter Date: 02/02/2015      PT End of Session - 02/02/15 1524    Visit Number 12   Number of Visits 18   Date for PT Re-Evaluation 02/07/15   Authorization Type medicare   Authorization - Visit Number 12   Authorization - Number of Visits 13   PT Start Time 0383   PT Stop Time 1428   PT Time Calculation (min) 40 min   Activity Tolerance Patient tolerated treatment well   Behavior During Therapy [facility] for tasks assessed/performed      Past Medical History  Diagnosis Date  . Hypertension   . High cholesterol   . Cancer     Proatate    Past Surgical History  Procedure Laterality Date  . Back surgery    . Appendectomy    . Vasectomy    . Total knee arthroplasty Right 12/20/2014    Procedure: TOTAL KNEE ARTHROPLASTY;  Surgeon: Carole Civil, MD;  Location: [facility];  Service: Orthopedics;  Laterality: Right;    There were no vitals filed for this visit.  Visit Diagnosis:  Knee stiffness, right  Weakness of right leg  Knee pain, acute, right  Localized edema  Difficulty walking down stairs      Subjective Assessment - 02/02/15 1349    Subjective Patient reports that he is doing well today, still having some pain with walking    Currently in Pain? Yes   Pain Score 3    Pain Location Knee   Pain Orientation Right                         [facility] Adult PT Treatment/Exercise - 02/02/15 0001    Knee/Hip Exercises: Stretches   Active Hamstring Stretch Right;3 reps;30 seconds   Active Hamstring Stretch Limitations 12 inch step    Hip Flexor Stretch Right;3 reps;30 seconds   Hip Flexor Stretch Limitations prone with rope    Knee: Self-Stretch to increase Flexion Right   Knee:  Self-Stretch Limitations 10 second holds, 10 reps    Gastroc Stretch Both;3 reps;30 seconds   Gastroc Stretch Limitations slant board    Knee/Hip Exercises: Standing   Heel Raises Both;1 set;15 reps   Heel Raises Limitations heel and toe raises    Step Down Right;1 set;10 reps   Step Down Limitations 4 inch box    Functional Squat --   Functional Squat Limitations --   Other Standing Knee Exercises 3D hip excursions 1x15    Other Standing Knee Exercises Hip ABD walks 2x85ft ; sit to stand with staggered stance 1x10 each side with UEs                 PT Education - 02/02/15 1524    Education provided No          PT Short Term Goals - 01/29/15 1431    PT SHORT TERM GOAL #1   Title I HEP    Time 2   Period Weeks   Status Achieved   PT SHORT TERM GOAL #2   Title ROM to be 4-125 to allow more normal gait pattern   Time 2   Period Weeks   Status Achieved   PT SHORT  TERM GOAL #3   Title Pt to be ambulationg for  60 minutes at a time without difficulty   Time 2   Period Weeks   Status On-going   PT SHORT TERM GOAL #4   Title Pain level to be no greater than a 2/10 80% of the day.   Time 2   Period Weeks   Status Achieved           PT Long Term Goals - 01/29/15 1431    PT LONG TERM GOAL #1   Title I in advance HEP   Time 4   Period Weeks   Status Achieved   PT LONG TERM GOAL #2   Title Pt strength to be at least a 4+/5 to allow him to go up and down steps holding onto one handrail in a reciprocal manner.   Time 4   Period Weeks   Status On-going   PT LONG TERM GOAL #3   Title Pt to state no pain on an average day    Time 4   Period Weeks   Status On-going   PT LONG TERM GOAL #4   Title Pt ROM to be 0 to 125 for normal gait   Time 4   Period Weeks   Status On-going   PT LONG TERM GOAL #5   Title Pt to be playing 9 holes of 3 par golf course   Time 6   Period Weeks   Status On-going               Plan - 02/02/15 1525    Clinical  Impression Statement Continued functional stretching and strengthening today with good tolerance by patient, however he did require cues for proper exercise performance throughout session.    Pt will benefit from skilled therapeutic intervention in order to improve on the following deficits Decreased activity tolerance;Decreased balance;Decreased range of motion;Decreased strength;Difficulty walking;Increased edema;Increased fascial restricitons;Pain   Rehab Potential Excellent   PT Frequency 3x / week   PT Duration 6 weeks   PT Treatment/Interventions ADLs/Self Care Home Management;Stair training;Functional mobility training;Therapeutic activities;Therapeutic exercise;Balance training;Manual techniques;Patient/family education;Scar mobilization;Passive range of motion   PT Next Visit Plan continue with focus on glut med and max strength    PT Home Exercise Plan given    Consulted and Agree with Plan of Care Patient        Problem List Patient Active Problem List   Diagnosis Date Noted  . Primary osteoarthritis of right knee 12/20/2014  . Primary osteoarthritis of knee   . Trigger finger of right hand 08/04/2013  . Arthritis of hand 08/04/2013  . Hypersalivation 09/27/2012  . Right sided abdominal pain 09/27/2012  . Headache, temporal 09/27/2012  . Weight loss 09/27/2012  . Hypertension 01/08/2012  . High cholesterol 01/08/2012  . Abnormal x-ray 01/08/2012    Deniece Ree PT, DPT [facility] [address] [place], [place], [address] Phone: [phone]   Fax:  [phone]  Name: John Huffman MRN: [id number] Date of Birth: 04/05/1929

## 2015-02-05 ENCOUNTER — Ambulatory Visit (HOSPITAL_COMMUNITY): Payer: Medicare Other | Admitting: Physical Therapy

## 2015-02-05 DIAGNOSIS — M25661 Stiffness of right knee, not elsewhere classified: Secondary | ICD-10-CM | POA: Diagnosis not present

## 2015-02-05 DIAGNOSIS — R600 Localized edema: Secondary | ICD-10-CM | POA: Diagnosis not present

## 2015-02-05 DIAGNOSIS — M25561 Pain in right knee: Secondary | ICD-10-CM | POA: Diagnosis not present

## 2015-02-05 DIAGNOSIS — R262 Difficulty in walking, not elsewhere classified: Secondary | ICD-10-CM | POA: Diagnosis not present

## 2015-02-05 DIAGNOSIS — R29898 Other symptoms and signs involving the musculoskeletal system: Secondary | ICD-10-CM

## 2015-02-05 NOTE — Therapy (Signed)
[place], [place], [address] Phone: [phone]   Fax:  [phone]  Physical Therapy Treatment  Patient Details  Name: John Huffman MRN: [id number] Date of Birth: [birth year] Referring Provider: Arther Abbott  Encounter Date: 02/05/2015      PT End of Session - 02/05/15 1618    Visit Number 13   Number of Visits 18   Authorization Type medicare   Authorization - Visit Number 13   Authorization - Number of Visits 13   PT Start Time 4562   PT Stop Time 1423   PT Time Calculation (min) 38 min   Activity Tolerance Patient tolerated treatment well   Behavior During Therapy [facility] for tasks assessed/performed      Past Medical History  Diagnosis Date  . Hypertension   . High cholesterol   . Cancer     Proatate    Past Surgical History  Procedure Laterality Date  . Back surgery    . Appendectomy    . Vasectomy    . Total knee arthroplasty Right 12/20/2014    Procedure: TOTAL KNEE ARTHROPLASTY;  Surgeon: Carole Civil, MD;  Location: [facility];  Service: Orthopedics;  Laterality: Right;    There were no vitals filed for this visit.  Visit Diagnosis:  Knee stiffness, right  Weakness of right leg  Knee pain, acute, right      Subjective Assessment - 02/05/15 1347    Subjective Pt reports that he thinks he is ready for discharge. His walking has gotten easier and is less painful. He continues to have some difficulty with stairs, and is not doing them with reciprocal pattern.   How long can you sit comfortably? no limitations   How long can you stand comfortably? no limitations   How long can you walk comfortably? 10 minutes without pain, but walks as long as he wants to.    Currently in Pain? Yes   Pain Score 1    Pain Location Knee   Pain Orientation Right            [facility] PT Assessment - 02/05/15 0001    Assessment   Referring Provider Arther Abbott   Observation/Other  Assessments   Focus on Therapeutic Outcomes (FOTO)  31% limited   Single Leg Stance   Comments Lt 21", Rt 30"   AROM   Right Knee Extension 3   Right Knee Flexion 123   Strength   Right Hip Flexion 5/5   Right Hip Extension 4-/5  was 3+   Right Hip ABduction 4/5  was 4-   Right Knee Flexion 4+/5  was 4+/5   Right Knee Extension 5/5                     [facility] Adult PT Treatment/Exercise - 02/05/15 0001    Knee/Hip Exercises: Stretches   Active Hamstring Stretch Right;3 reps;30 seconds   Active Hamstring Stretch Limitations 12 inch step    Knee: Self-Stretch to increase Flexion Right   Knee: Self-Stretch Limitations 10 second holds, 10 reps    Gastroc Stretch Both;3 reps;30 seconds   Gastroc Stretch Limitations slant board    Knee/Hip Exercises: Standing   Lateral Step Up Right;10 reps;Step Height: 6"   Forward Step Up 10 reps;Hand Hold: 1;Step Height: 6"   Other Standing Knee Exercises Hip ABD walks 2x62f                 PT  Education - 2015/02/23 1618    Education provided Yes   Education Details HEP reviewed, goals addressed   Person(s) Educated Patient   Methods Explanation;Handout   Comprehension Verbalized understanding;Returned demonstration          PT Short Term Goals - 02-23-15 1408    PT SHORT TERM GOAL #1   Title I HEP    Time 2   Period Weeks   Status Achieved   PT SHORT TERM GOAL #2   Title ROM to be 4-125 to allow more normal gait pattern   Time 2   Period Weeks   Status Achieved   PT SHORT TERM GOAL #3   Title Pt to be ambulationg for  60 minutes at a time without difficulty   Time 2   Period Weeks   Status Achieved   PT SHORT TERM GOAL #4   Time 2   Period Weeks   Status Achieved           PT Long Term Goals - 02/23/2015 1409    PT LONG TERM GOAL #1   Title I in advance HEP   Time 4   Period Weeks   Status Achieved   PT LONG TERM GOAL #2   Title Pt strength to be at least a 4+/5 to allow him to go up and down  steps holding onto one handrail in a reciprocal manner.   Time 4   Period Weeks   Status Partially Met   PT LONG TERM GOAL #3   Title Pt to state no pain on an average day    Time 4   Period Weeks   Status Achieved   PT LONG TERM GOAL #4   Title Pt ROM to be 0 to 125 for normal gait   Time 4   Period Weeks   Status Partially Met   PT LONG TERM GOAL #5   Title Pt to be playing 9 holes of 3 par golf course   Baseline Pt has not attempted to golf   Time 6   Period Weeks   Status Not Met               Plan - 2015/02/23 1620    Clinical Impression Statement Reassessment completed today. Pt has made steady progress towards his LTGs, and meeting 2 goals and partially meeting 2 other goals. Pt continues to demonstrate decreased knee extension ROM, but has been eduated on completing prone knee hangs and seated extension with foot on towel roll. Pt reports that at this time, he would like to continue independently with his HEP. Pt's goal of returning to golfing was unable to be assessed, as pt has not attempted to golf yet.    PT Treatment/Interventions ADLs/Self Care Home Management;Stair training;Functional mobility training;Therapeutic activities;Therapeutic exercise;Balance training;Manual techniques;Patient/family education;Scar mobilization;Passive range of motion   PT Home Exercise Plan given           G-Codes - 02-23-15 1622    Functional Assessment Tool Used functional status; clinical judgement.    Functional Limitation Mobility: Walking and moving around   Mobility: Walking and Moving Around Goal Status [phone]) At least 20 percent but less than 40 percent impaired, limited or restricted   Mobility: Walking and Moving Around Discharge Status [phone]) At least 20 percent but less than 40 percent impaired, limited or restricted      Problem List Patient Active Problem List   Diagnosis Date Noted  . Primary osteoarthritis of right knee 12/20/2014  .  Primary osteoarthritis  of knee   . Trigger finger of right hand 08/04/2013  . Arthritis of hand 08/04/2013  . Hypersalivation 09/27/2012  . Right sided abdominal pain 09/27/2012  . Headache, temporal 09/27/2012  . Weight loss 09/27/2012  . Hypertension 01/08/2012  . High cholesterol 01/08/2012  . Abnormal x-ray 01/08/2012     PHYSICAL THERAPY DISCHARGE SUMMARY  Visits from Start of Care: 13  Current functional level related to goals / functional outcomes: Pt currently has 3-126 degrees of ROM in R knee, demonstrates improved RLE strength, and demonstrates improved gait mechanics.    Remaining deficits: Pt currently lacks 3 degrees of knee extension   Education / Equipment: HEP  Plan: Patient agrees to discharge.  Patient goals were partially met. Patient is being discharged due to being pleased with the current functional level.  ?????      Hilma Favors, PT, DPT [phone] 02/05/2015, 4:25 PM  [place], [place], [address] Phone: [phone]   Fax:  [phone]  Name: CALAN DOREN MRN: [id number] Date of Birth: 05-29-1928

## 2015-02-05 NOTE — Patient Instructions (Signed)
Half Squat to Chair    Stand with feet shoulder width apart. Push buttocks backward and lower slowly, touching chair lightly and returning to standing position. Complete _1__ sets of _10__ repetitions. Perform __1_ sessions per day.  http://gtsc.exer.us/436   Copyright  VHI. All rights reserved.  Step: Up, Anterior    Stand facing step. Place involved leg up. Raise body using top leg only. Step down backward, involved leg first, lower body using other leg. Repeat __10__ times per set. Do __1__ sets per session. Do _5-7___ sessions per week.  Copyright  VHI. All rights reserved.  Step: Up, Lateral    Stand with side toward step. Place involved leg up. Raise body using top leg only. Step off other side, involved leg first, lower body using other leg. Repeat __10__ times per set. Do _1___ sets per session. Do _5-7___ sessions per week.  Copyright  VHI. All rights reserved.

## 2015-02-06 ENCOUNTER — Inpatient Hospital Stay (HOSPITAL_COMMUNITY): Admit: 2015-02-06 | Payer: PRIVATE HEALTH INSURANCE | Admitting: Orthopedic Surgery

## 2015-02-06 ENCOUNTER — Encounter (HOSPITAL_COMMUNITY): Payer: Self-pay

## 2015-02-06 SURGERY — ARTHROPLASTY, KNEE, TOTAL
Anesthesia: Choice | Site: Knee | Laterality: Right

## 2015-02-12 DIAGNOSIS — C61 Malignant neoplasm of prostate: Secondary | ICD-10-CM | POA: Diagnosis not present

## 2015-02-12 DIAGNOSIS — R972 Elevated prostate specific antigen [PSA]: Secondary | ICD-10-CM | POA: Diagnosis not present

## 2015-02-12 DIAGNOSIS — R9721 Rising PSA following treatment for malignant neoplasm of prostate: Secondary | ICD-10-CM | POA: Diagnosis not present

## 2015-02-12 DIAGNOSIS — N320 Bladder-neck obstruction: Secondary | ICD-10-CM | POA: Diagnosis not present

## 2015-03-01 DIAGNOSIS — Z23 Encounter for immunization: Secondary | ICD-10-CM | POA: Diagnosis not present

## 2015-03-05 DIAGNOSIS — R972 Elevated prostate specific antigen [PSA]: Secondary | ICD-10-CM | POA: Diagnosis not present

## 2015-03-05 DIAGNOSIS — C61 Malignant neoplasm of prostate: Secondary | ICD-10-CM | POA: Diagnosis not present

## 2015-03-07 DIAGNOSIS — Z85828 Personal history of other malignant neoplasm of skin: Secondary | ICD-10-CM | POA: Diagnosis not present

## 2015-03-07 DIAGNOSIS — C44719 Basal cell carcinoma of skin of left lower limb, including hip: Secondary | ICD-10-CM | POA: Diagnosis not present

## 2015-03-07 DIAGNOSIS — B079 Viral wart, unspecified: Secondary | ICD-10-CM | POA: Diagnosis not present

## 2015-03-07 DIAGNOSIS — D225 Melanocytic nevi of trunk: Secondary | ICD-10-CM | POA: Diagnosis not present

## 2015-03-07 DIAGNOSIS — L570 Actinic keratosis: Secondary | ICD-10-CM | POA: Diagnosis not present

## 2015-03-07 DIAGNOSIS — L821 Other seborrheic keratosis: Secondary | ICD-10-CM | POA: Diagnosis not present

## 2015-03-07 DIAGNOSIS — D485 Neoplasm of uncertain behavior of skin: Secondary | ICD-10-CM | POA: Diagnosis not present

## 2015-03-07 DIAGNOSIS — L720 Epidermal cyst: Secondary | ICD-10-CM | POA: Diagnosis not present

## 2015-03-13 ENCOUNTER — Ambulatory Visit (INDEPENDENT_AMBULATORY_CARE_PROVIDER_SITE_OTHER): Payer: Self-pay | Admitting: Orthopedic Surgery

## 2015-03-13 ENCOUNTER — Encounter: Payer: Self-pay | Admitting: Orthopedic Surgery

## 2015-03-13 VITALS — BP 117/72 | Ht 72.0 in | Wt 178.0 lb

## 2015-03-13 DIAGNOSIS — Z96651 Presence of right artificial knee joint: Secondary | ICD-10-CM

## 2015-03-13 DIAGNOSIS — Z4789 Encounter for other orthopedic aftercare: Secondary | ICD-10-CM

## 2015-03-13 NOTE — Progress Notes (Signed)
Chief Complaint  Patient presents with  . Follow-up    Right TKA DOS 12/20/14   83 days   The patient had knee replacement surgery on the right on August 31 he's within his 12 week postop. He is doing well except for some crepitance in the patellofemoral area which is most likely impingement syndrome but right now he says the discomfort is mild hip bothers him that he can feel it crunching  He has excellent flexion in his knee 125 his flexion contracture is less than 2-3 his incision is clean his calf is supple he's walking without support  When he comes back in 3 months if he still having trouble with this impingement lesion we will arthroscopically remove it and he is okay with that.

## 2015-04-09 DIAGNOSIS — N320 Bladder-neck obstruction: Secondary | ICD-10-CM | POA: Diagnosis not present

## 2015-04-09 DIAGNOSIS — R972 Elevated prostate specific antigen [PSA]: Secondary | ICD-10-CM | POA: Diagnosis not present

## 2015-04-09 DIAGNOSIS — R9721 Rising PSA following treatment for malignant neoplasm of prostate: Secondary | ICD-10-CM | POA: Diagnosis not present

## 2015-04-09 DIAGNOSIS — C61 Malignant neoplasm of prostate: Secondary | ICD-10-CM | POA: Diagnosis not present

## 2015-06-14 ENCOUNTER — Ambulatory Visit: Payer: Medicare Other | Admitting: Orthopedic Surgery

## 2015-06-18 ENCOUNTER — Encounter: Payer: Self-pay | Admitting: Orthopedic Surgery

## 2015-06-18 ENCOUNTER — Ambulatory Visit (INDEPENDENT_AMBULATORY_CARE_PROVIDER_SITE_OTHER): Payer: Medicare Other | Admitting: Orthopedic Surgery

## 2015-06-18 VITALS — BP 123/60 | Ht 72.0 in | Wt 173.0 lb

## 2015-06-18 DIAGNOSIS — Z96651 Presence of right artificial knee joint: Secondary | ICD-10-CM | POA: Diagnosis not present

## 2015-06-18 NOTE — Patient Instructions (Signed)
CALL BACK WITH DATE FOR SURGERY

## 2015-06-18 NOTE — Addendum Note (Signed)
Addended by: Christia Reading on: 06/18/2015 04:52 PM   Modules accepted: Orders

## 2015-06-18 NOTE — Progress Notes (Signed)
Patient ID: John Huffman, male   DOB: January 07, 1929, 81 y.o.   MRN: [id number]  Chief Complaint  Patient presents with  . Follow-up    FOLLOW UP RIGHT TKA, DOS 12/20/14    BP 123/60 mmHg  Ht 6' (1.829 m)  Wt 173 lb (78.472 kg)  BMI 23.46 kg/m2  The patient is 3 months after right total knee with the posterior stabilized implant  He's having impingement type symptoms pain in the front of the knee especially when getting out of a chair. He has no symptoms really when he is walking maybe a little discomfort  Review of systems negative for numbness tingling fever chills Past Medical History  Diagnosis Date  . Hypertension   . High cholesterol   . Cancer [facility])     Proatate     Exam shows a stable knee full extension 125 knee flexion no weakness in the quadriceps knee is stable incision is clean dry and intact no effusion  Neurovascular exam remains intact coordination and balance are excellent gait and station are normal mood is pleasant is oriented 3 her parents well-groomed vitals as recorded    ASSESSMENT AND PLAN   Impingement syndrome right knee after total knee recommend arthroscopic debridement patient aware of risk benefit ratio. Based on the pain getting out of the chair and no resolution after 6 months, he agrees with proceeding with arthroscopic debridement right knee

## 2015-06-27 ENCOUNTER — Encounter: Payer: Self-pay | Admitting: Gastroenterology

## 2015-06-28 DIAGNOSIS — R413 Other amnesia: Secondary | ICD-10-CM | POA: Diagnosis not present

## 2015-06-28 DIAGNOSIS — Z6824 Body mass index (BMI) 24.0-24.9, adult: Secondary | ICD-10-CM | POA: Diagnosis not present

## 2015-06-28 DIAGNOSIS — I10 Essential (primary) hypertension: Secondary | ICD-10-CM | POA: Diagnosis not present

## 2015-06-29 NOTE — Patient Instructions (Signed)
Your procedure is scheduled on: 07/05/2015  Report to [facility] at  6:15   AM.  Call this number if you have problems the morning of surgery: [phone]   Remember:   Do not drink or eat food:After Midnight.  :  Take these medicines the morning of surgery with A SIP OF WATER: Amlodipine and losartan   Do not wear jewelry, make-up or nail polish.  Do not wear lotions, powders, or perfumes. You may wear deodorant.  Do not shave 48 hours prior to surgery. Men may shave face and neck.  Do not bring valuables to the hospital.  Contacts, dentures or bridgework may not be worn into surgery.  Leave suitcase in the car. After surgery it may be brought to your room.  For patients admitted to the hospital, checkout time is 11:00 AM the day of discharge.   Patients discharged the day of surgery will not be allowed to drive home.    Special Instructions: Shower using CHG night before surgery and shower the day of surgery use CHG.  Use special wash - you have one bottle of CHG for all showers.  You should use approximately 1/2 of the bottle for each shower. Knee Arthroscopy, Care After Refer to this sheet in the next few weeks. These instructions provide you with information about caring for yourself after your procedure. Your health care provider may also give you more specific instructions. Your treatment has been planned according to current medical practices, but problems sometimes occur. Call your health care provider if you have any problems or questions after your procedure. WHAT TO EXPECT AFTER THE PROCEDURE After your procedure, it is common to have:  Soreness.  Pain. HOME CARE INSTRUCTIONS Bathing  Do not take baths, swim, or use a hot tub until your health care provider approves. Incision Care  There are many different ways to close and cover an incision, including stitches, skin glue, and adhesive strips. Follow your health care provider's instructions about:  Incision  care.  Bandage (dressing) changes and removal.  Incision closure removal.  Check your incision area every day for signs of infection. Watch for:  Redness, swelling, or pain.  Fluid, blood, or pus. Activity  Avoid strenuous activities for as long as directed by your health care provider.  Return to your normal activities as directed by your health care provider. Ask your health care provider what activities are safe for you.  Perform range-of-motion exercises only as directed by your health care provider.  Do not lift anything that is heavier than 10 lb (4.5 kg).  Do not drive or operate heavy machinery while taking pain medicine.  If you were given crutches, use them as directed by your health care provider. Managing Pain, Stiffness, and Swelling  If directed, apply ice to the injured area:  Put ice in a plastic bag.  Place a towel between your skin and the bag.  Leave the ice on for 20 minutes, 2-3 times per day.  Raise the injured area above the level of your heart while you are sitting or lying down as directed by your health care provider. General Instructions  Keep all follow-up visits as directed by your health care provider. This is important.  Take medicines only as directed by your health care provider.  Do not use any tobacco products, including cigarettes, chewing tobacco, or electronic cigarettes. If you need help quitting, ask your health care provider.  If you were given compression stockings, wear them as directed  by your health care provider. These stockings help prevent blood clots and reduce swelling in your legs. SEEK MEDICAL CARE IF:  You have severe pain with any movement of your knee.  You notice a bad smell coming from the incision or dressing.  You have redness, swelling, or pain at the site of your incision.  You have fluid, blood, or pus coming from your incision. SEEK IMMEDIATE MEDICAL CARE IF:  You develop a rash.  You have a  fever.  You have difficulty breathing or have shortness of breath.  You develop pain in your calves or in the back of your knee.  You develop chest pain.  You develop numbness or tingling in your leg or foot.   This information is not intended to replace advice given to you by your health care provider. Make sure you discuss any questions you have with your health care provider.   Document Released: 10/25/2004 Document Revised: 08/22/2014 Document Reviewed: 04/03/2014 Elsevier Interactive Patient Education 2016 [place] [facility], Adult, Care After Refer to this sheet in the next few weeks. These instructions provide you with information on caring for yourself after your procedure. Your health care provider may also give you more specific instructions. Your treatment has been planned according to current medical practices, but problems sometimes occur. Call your health care provider if you have any problems or questions after your procedure. WHAT TO EXPECT AFTER THE PROCEDURE After the procedure, it is typical to experience:  Sleepiness.  Nausea and vomiting. HOME CARE INSTRUCTIONS  For the first 24 hours after general anesthesia:  Have a responsible person with you.  Do not drive a car. If you are alone, do not take public transportation.  Do not drink alcohol.  Do not take medicine that has not been prescribed by your health care provider.  Do not sign important papers or make important decisions.  You may resume a normal diet and activities as directed by your health care provider.  Change bandages (dressings) as directed.  If you have questions or problems that seem related to general anesthesia, call the hospital and ask for the anesthetist or anesthesiologist on call. SEEK MEDICAL CARE IF:  You have nausea and vomiting that continue the day after anesthesia.  You develop a rash. SEEK IMMEDIATE MEDICAL CARE IF:   You have difficulty  breathing.  You have chest pain.  You have any allergic problems.   This information is not intended to replace advice given to you by your health care provider. Make sure you discuss any questions you have with your health care provider.   Document Released: 07/14/2000 Document Revised: 04/28/2014 Document Reviewed: 08/06/2011 Elsevier Interactive Patient Education [employer].

## 2015-07-02 ENCOUNTER — Other Ambulatory Visit: Payer: Self-pay | Admitting: *Deleted

## 2015-07-02 ENCOUNTER — Encounter (HOSPITAL_COMMUNITY)
Admission: RE | Admit: 2015-07-02 | Discharge: 2015-07-02 | Disposition: A | Discharge status: Home / Self Care | Payer: Medicare Other | Source: Ambulatory Visit | Attending: Orthopedic Surgery | Admitting: Orthopedic Surgery

## 2015-07-02 ENCOUNTER — Encounter (HOSPITAL_COMMUNITY): Payer: Self-pay

## 2015-07-02 DIAGNOSIS — M1991 Primary osteoarthritis, unspecified site: Secondary | ICD-10-CM | POA: Diagnosis not present

## 2015-07-02 DIAGNOSIS — Z8546 Personal history of malignant neoplasm of prostate: Secondary | ICD-10-CM | POA: Diagnosis not present

## 2015-07-02 DIAGNOSIS — M25561 Pain in right knee: Secondary | ICD-10-CM | POA: Diagnosis not present

## 2015-07-02 DIAGNOSIS — Z96651 Presence of right artificial knee joint: Secondary | ICD-10-CM | POA: Diagnosis not present

## 2015-07-02 DIAGNOSIS — I10 Essential (primary) hypertension: Secondary | ICD-10-CM | POA: Diagnosis not present

## 2015-07-02 DIAGNOSIS — Z01812 Encounter for preprocedural laboratory examination: Secondary | ICD-10-CM | POA: Diagnosis not present

## 2015-07-02 DIAGNOSIS — M65861 Other synovitis and tenosynovitis, right lower leg: Secondary | ICD-10-CM | POA: Diagnosis not present

## 2015-07-02 DIAGNOSIS — Z79899 Other long term (current) drug therapy: Secondary | ICD-10-CM | POA: Diagnosis not present

## 2015-07-02 DIAGNOSIS — E7800 Pure hypercholesterolemia, unspecified: Secondary | ICD-10-CM | POA: Diagnosis not present

## 2015-07-02 DIAGNOSIS — M25861 Other specified joint disorders, right knee: Secondary | ICD-10-CM | POA: Diagnosis not present

## 2015-07-02 DIAGNOSIS — Z9889 Other specified postprocedural states: Secondary | ICD-10-CM

## 2015-07-02 HISTORY — DX: Unspecified osteoarthritis, unspecified site: M19.90

## 2015-07-02 LAB — CBC WITH DIFFERENTIAL/PLATELET
BASOS PCT: 1 %
Basophils Absolute: 0 10*3/uL (ref 0.0–0.1)
EOS ABS: 0.1 10*3/uL (ref 0.0–0.7)
EOS PCT: 2 %
HCT: 46.5 % (ref 39.0–52.0)
HEMOGLOBIN: 15.2 g/dL (ref 13.0–17.0)
Lymphocytes Relative: 22 %
Lymphs Abs: 1.3 10*3/uL (ref 0.7–4.0)
MCH: 29.1 pg (ref 26.0–34.0)
MCHC: 32.7 g/dL (ref 30.0–36.0)
MCV: 88.9 fL (ref 78.0–100.0)
Monocytes Absolute: 0.5 10*3/uL (ref 0.1–1.0)
Monocytes Relative: 8 %
NEUTROS ABS: 4.3 10*3/uL (ref 1.7–7.7)
NEUTROS PCT: 69 %
PLATELETS: 214 10*3/uL (ref 150–400)
RBC: 5.23 MIL/uL (ref 4.22–5.81)
RDW: 13.8 % (ref 11.5–15.5)
WBC: 6.2 10*3/uL (ref 4.0–10.5)

## 2015-07-02 LAB — BASIC METABOLIC PANEL
Anion gap: 7 (ref 5–15)
BUN: 16 mg/dL (ref 6–20)
CALCIUM: 9.3 mg/dL (ref 8.9–10.3)
CHLORIDE: 105 mmol/L (ref 101–111)
CO2: 26 mmol/L (ref 22–32)
CREATININE: 0.79 mg/dL (ref 0.61–1.24)
Glucose, Bld: 104 mg/dL — ABNORMAL HIGH (ref 65–99)
Potassium: 4.2 mmol/L (ref 3.5–5.1)
SODIUM: 138 mmol/L (ref 135–145)

## 2015-07-02 NOTE — Pre-Procedure Instructions (Signed)
Patient given information to sign up for my chart at home. 

## 2015-07-04 NOTE — H&P (Signed)
[name] is an 82 y.o. male.    Chief Complaint: Right knee pain HPI: This is an 82 year old male had a right total knee in August 2016. He had a posterior stabilized implant with the [place] system.  He did well however he is having mild nonradiating pain in the front of his knee especially when getting from a seated position. Associated with crepitance. He does not have any real giving way may be a little bit of catching is present.  Past Medical History  Diagnosis Date  . Hypertension   . High cholesterol   . Cancer ([place])     Proatate  . Arthritis     Past Surgical History  Procedure Laterality Date  . Back surgery    . Appendectomy    . Vasectomy    . Total knee arthroplasty Right 12/20/2014    Procedure: TOTAL KNEE ARTHROPLASTY;  Surgeon: Carole Civil, MD;  Location: [facility];  Service: Orthopedics;  Laterality: Right;   Social History   Social History  . Marital Status: Married    Spouse Name: N/A  . Number of Children: N/A  . Years of Education: N/A   Occupational History  . Not on file.   Social History Main Topics  . Smoking status: Never Smoker   . Smokeless tobacco: Never Used  . Alcohol Use: No  . Drug Use: No  . Sexual Activity: No   Other Topics Concern  . Not on file   Social History Narrative    No family history reported   Social History:  reports that he has never smoked. He has never used smokeless tobacco. He reports that he does not drink alcohol or use illicit drugs.  Allergies: No Known Allergies  No prescriptions prior to admission    No results found for this or any previous visit (from the past 48 hour(s)). No results found.  Review of Systems  Musculoskeletal: Positive for joint pain.  All other systems reviewed and are negative.    Physical Exam  Constitutional: He is oriented to person, place, and time. He appears well-developed and well-nourished. No distress.  HENT:  Head: Normocephalic and atraumatic.  Eyes:  Conjunctivae and EOM are normal. Pupils are equal, round, and reactive to light.  Neck: Normal range of motion. Neck supple. No JVD present. No tracheal deviation present. No thyromegaly present.  Cardiovascular: Normal rate and intact distal pulses.   Respiratory: No stridor. No respiratory distress. He has no wheezes.  GI: He exhibits no distension.  Musculoskeletal:  Right knee midline incision healed normally. He has 2 flexion contracture with knee flexion 120+ degrees. He stable and the coronal and sagittal plane he has excellent quadriceps strength. No signs of instability swelling and effusion. There is crepitance on range of motion which is exacerbated by patellofemoral compression.  Lymphadenopathy:    He has no cervical adenopathy.  Neurological: He is alert and oriented to person, place, and time. He has normal reflexes. He displays normal reflexes. He exhibits normal muscle tone. Coordination normal.  Skin: Skin is warm and dry. No rash noted. He is not diaphoretic. No erythema. No pallor.  Psychiatric: He has a normal mood and affect. His behavior is normal. Judgment and thought content normal.     Assessment/Plan Impingement syndrome right knee status post total knee  Treatment options of continued nonoperative care versus operative treatment were discussed with the patient and he has agreed to proceed with surgery  We are going to perform arthroscopic  removal of impinging lesion by performing arthroscopy of the right knee  Arther Abbott, MD 07/04/2015, 4:02 PM

## 2015-07-05 ENCOUNTER — Ambulatory Visit (HOSPITAL_COMMUNITY)
Admission: RE | Admit: 2015-07-05 | Discharge: 2015-07-05 | Disposition: A | Discharge status: Home / Self Care | Payer: Medicare Other | Source: Ambulatory Visit | Attending: Orthopedic Surgery | Admitting: Orthopedic Surgery

## 2015-07-05 ENCOUNTER — Encounter (HOSPITAL_COMMUNITY): Payer: Self-pay | Admitting: *Deleted

## 2015-07-05 ENCOUNTER — Ambulatory Visit (HOSPITAL_COMMUNITY): Payer: Medicare Other | Admitting: Anesthesiology

## 2015-07-05 ENCOUNTER — Encounter (HOSPITAL_COMMUNITY)
Admission: RE | Disposition: A | Discharge status: Home / Self Care | Payer: Self-pay | Source: Ambulatory Visit | Attending: Orthopedic Surgery

## 2015-07-05 DIAGNOSIS — M25561 Pain in right knee: Secondary | ICD-10-CM | POA: Diagnosis not present

## 2015-07-05 DIAGNOSIS — Z96651 Presence of right artificial knee joint: Secondary | ICD-10-CM | POA: Insufficient documentation

## 2015-07-05 DIAGNOSIS — M65969 Unspecified synovitis and tenosynovitis, unspecified lower leg: Secondary | ICD-10-CM | POA: Insufficient documentation

## 2015-07-05 DIAGNOSIS — M25861 Other specified joint disorders, right knee: Secondary | ICD-10-CM | POA: Diagnosis not present

## 2015-07-05 DIAGNOSIS — Z01812 Encounter for preprocedural laboratory examination: Secondary | ICD-10-CM | POA: Diagnosis not present

## 2015-07-05 DIAGNOSIS — M76891 Other specified enthesopathies of right lower limb, excluding foot: Secondary | ICD-10-CM | POA: Diagnosis not present

## 2015-07-05 DIAGNOSIS — M659 Synovitis and tenosynovitis, unspecified: Secondary | ICD-10-CM | POA: Diagnosis not present

## 2015-07-05 DIAGNOSIS — M65861 Other synovitis and tenosynovitis, right lower leg: Secondary | ICD-10-CM | POA: Diagnosis not present

## 2015-07-05 DIAGNOSIS — Z8546 Personal history of malignant neoplasm of prostate: Secondary | ICD-10-CM | POA: Insufficient documentation

## 2015-07-05 DIAGNOSIS — I10 Essential (primary) hypertension: Secondary | ICD-10-CM | POA: Diagnosis not present

## 2015-07-05 DIAGNOSIS — E7800 Pure hypercholesterolemia, unspecified: Secondary | ICD-10-CM | POA: Insufficient documentation

## 2015-07-05 DIAGNOSIS — Z79899 Other long term (current) drug therapy: Secondary | ICD-10-CM | POA: Insufficient documentation

## 2015-07-05 DIAGNOSIS — M1991 Primary osteoarthritis, unspecified site: Secondary | ICD-10-CM | POA: Insufficient documentation

## 2015-07-05 HISTORY — PX: KNEE ARTHROSCOPY: SHX127

## 2015-07-05 SURGERY — ARTHROSCOPY, KNEE
Anesthesia: General | Laterality: Right

## 2015-07-05 MED ORDER — KETOROLAC TROMETHAMINE 30 MG/ML IJ SOLN
INTRAMUSCULAR | Status: AC
Start: 2015-07-05 — End: 2015-07-05
  Filled 2015-07-05: qty 1

## 2015-07-05 MED ORDER — EPHEDRINE SULFATE 50 MG/ML IJ SOLN
INTRAMUSCULAR | Status: DC | PRN
  Administered 2015-07-05 (×2): 10 mg via INTRAVENOUS
  Administered 2015-07-05: 5 mg via INTRAVENOUS

## 2015-07-05 MED ORDER — ONDANSETRON HCL 4 MG/2ML IJ SOLN
INTRAMUSCULAR | Status: AC
  Filled 2015-07-05: qty 2

## 2015-07-05 MED ORDER — FENTANYL CITRATE (PF) 100 MCG/2ML IJ SOLN
INTRAMUSCULAR | Status: AC
  Filled 2015-07-05: qty 2

## 2015-07-05 MED ORDER — CEFAZOLIN SODIUM-DEXTROSE 2-3 GM-% IV SOLR
2.0000 g | INTRAVENOUS | Status: AC
  Administered 2015-07-05: 2 g via INTRAVENOUS
  Filled 2015-07-05: qty 50

## 2015-07-05 MED ORDER — PHENYLEPHRINE HCL 10 MG/ML IJ SOLN
INTRAMUSCULAR | Status: DC | PRN
  Administered 2015-07-05: 40 ug via INTRAVENOUS

## 2015-07-05 MED ORDER — LIDOCAINE HCL (PF) 1 % IJ SOLN
INTRAMUSCULAR | Status: AC
  Filled 2015-07-05: qty 5

## 2015-07-05 MED ORDER — FENTANYL CITRATE (PF) 100 MCG/2ML IJ SOLN
25.0000 ug | INTRAMUSCULAR | Status: AC
  Administered 2015-07-05 (×2): 25 ug via INTRAVENOUS

## 2015-07-05 MED ORDER — PROPOFOL 10 MG/ML IV BOLUS
INTRAVENOUS | Status: AC
  Filled 2015-07-05: qty 40

## 2015-07-05 MED ORDER — ONDANSETRON HCL 4 MG/2ML IJ SOLN
4.0000 mg | Freq: Once | INTRAMUSCULAR | Status: AC
  Administered 2015-07-05: 4 mg via INTRAVENOUS

## 2015-07-05 MED ORDER — PROMETHAZINE HCL 12.5 MG PO TABS: 12.5000 mg | ORAL_TABLET | Freq: Four times a day (QID) | ORAL | Status: DC | PRN

## 2015-07-05 MED ORDER — MIDAZOLAM HCL 2 MG/2ML IJ SOLN
INTRAMUSCULAR | Status: AC
  Filled 2015-07-05: qty 2

## 2015-07-05 MED ORDER — LACTATED RINGERS IV SOLN
INTRAVENOUS | Status: DC
  Administered 2015-07-05: 07:00:00 via INTRAVENOUS

## 2015-07-05 MED ORDER — PROPOFOL 10 MG/ML IV BOLUS
INTRAVENOUS | Status: DC | PRN
  Administered 2015-07-05: 130 mg via INTRAVENOUS

## 2015-07-05 MED ORDER — MIDAZOLAM HCL 2 MG/2ML IJ SOLN
1.0000 mg | INTRAMUSCULAR | Status: DC | PRN
  Administered 2015-07-05: 2 mg via INTRAVENOUS

## 2015-07-05 MED ORDER — CHLORHEXIDINE GLUCONATE 4 % EX LIQD: 60.0000 mL | Freq: Once | CUTANEOUS | Status: DC

## 2015-07-05 MED ORDER — FENTANYL CITRATE (PF) 100 MCG/2ML IJ SOLN: 25.0000 ug | INTRAMUSCULAR | Status: DC | PRN

## 2015-07-05 MED ORDER — SUCCINYLCHOLINE CHLORIDE 20 MG/ML IJ SOLN
INTRAMUSCULAR | Status: AC
  Filled 2015-07-05: qty 1

## 2015-07-05 MED ORDER — LIDOCAINE HCL (CARDIAC) 20 MG/ML IV SOLN
INTRAVENOUS | Status: DC | PRN
  Administered 2015-07-05: 25 mg via INTRAVENOUS

## 2015-07-05 MED ORDER — BUPIVACAINE-EPINEPHRINE (PF) 0.5% -1:200000 IJ SOLN
INTRAMUSCULAR | Status: DC | PRN
  Administered 2015-07-05 (×2): 30 mL

## 2015-07-05 MED ORDER — ONDANSETRON HCL 4 MG/2ML IJ SOLN: 4.0000 mg | Freq: Once | INTRAMUSCULAR | Status: DC | PRN

## 2015-07-05 MED ORDER — BUPIVACAINE HCL (PF) 0.5 % IJ SOLN
INTRAMUSCULAR | Status: AC
  Filled 2015-07-05: qty 30

## 2015-07-05 MED ORDER — HYDROCODONE-ACETAMINOPHEN 5-325 MG PO TABS
ORAL_TABLET | ORAL | Status: AC
  Filled 2015-07-05: qty 1

## 2015-07-05 MED ORDER — LACTATED RINGERS IV SOLN
INTRAVENOUS | Status: DC | PRN
  Administered 2015-07-05 (×2): via INTRAVENOUS

## 2015-07-05 MED ORDER — SUCCINYLCHOLINE CHLORIDE 20 MG/ML IJ SOLN
INTRAMUSCULAR | Status: DC | PRN
  Administered 2015-07-05: 140 mg via INTRAVENOUS

## 2015-07-05 MED ORDER — KETOROLAC TROMETHAMINE 30 MG/ML IJ SOLN
30.0000 mg | Freq: Once | INTRAMUSCULAR | Status: AC
  Administered 2015-07-05: 30 mg via INTRAVENOUS

## 2015-07-05 MED ORDER — ARTIFICIAL TEARS OP OINT
TOPICAL_OINTMENT | OPHTHALMIC | Status: DC | PRN
  Administered 2015-07-05: 1 via OPHTHALMIC

## 2015-07-05 MED ORDER — SODIUM CHLORIDE 0.9 % IR SOLN
Status: DC | PRN
  Administered 2015-07-05 (×5): 3000 mL

## 2015-07-05 MED ORDER — BUPIVACAINE-EPINEPHRINE (PF) 0.5% -1:200000 IJ SOLN
INTRAMUSCULAR | Status: AC
  Filled 2015-07-05: qty 60

## 2015-07-05 MED ORDER — HYDROCODONE-ACETAMINOPHEN 5-325 MG PO TABS: 1.0000 | ORAL_TABLET | Freq: Four times a day (QID) | ORAL | Status: DC | PRN

## 2015-07-05 MED ORDER — HYDROCODONE-ACETAMINOPHEN 5-325 MG PO TABS
1.0000 | ORAL_TABLET | Freq: Once | ORAL | Status: AC
  Administered 2015-07-05: 1 via ORAL

## 2015-07-05 MED ORDER — GLYCOPYRROLATE 0.2 MG/ML IJ SOLN
INTRAMUSCULAR | Status: AC
  Filled 2015-07-05: qty 1

## 2015-07-05 MED ORDER — EPHEDRINE SULFATE 50 MG/ML IJ SOLN
INTRAMUSCULAR | Status: AC
  Filled 2015-07-05: qty 1

## 2015-07-05 MED ORDER — SODIUM CHLORIDE 0.9 % IJ SOLN
INTRAMUSCULAR | Status: AC
  Filled 2015-07-05: qty 10

## 2015-07-05 MED ORDER — EPINEPHRINE HCL 1 MG/ML IJ SOLN
INTRAMUSCULAR | Status: AC
  Filled 2015-07-05: qty 5

## 2015-07-05 MED ORDER — FENTANYL CITRATE (PF) 100 MCG/2ML IJ SOLN
INTRAMUSCULAR | Status: DC | PRN
  Administered 2015-07-05: 25 ug via INTRAVENOUS
  Administered 2015-07-05 (×2): 50 ug via INTRAVENOUS

## 2015-07-05 SURGICAL SUPPLY — 48 items
BAG HAMPER (MISCELLANEOUS) ×3 IMPLANT
BANDAGE ELASTIC 6 VELCRO NS (GAUZE/BANDAGES/DRESSINGS) ×3 IMPLANT
BLADE AGGRESSIVE PLUS 4.0 (BLADE) ×3 IMPLANT
BLADE SURG SZ11 CARB STEEL (BLADE) ×3 IMPLANT
CHLORAPREP W/TINT 26ML (MISCELLANEOUS) ×3 IMPLANT
CLOTH BEACON ORANGE TIMEOUT ST (SAFETY) ×3 IMPLANT
COOLER CRYO IC GRAV AND TUBE (ORTHOPEDIC SUPPLIES) ×3 IMPLANT
COVER PROBE W GEL 5X96 (DRAPES) ×3 IMPLANT
CUFF CRYO KNEE18X23 MED (MISCELLANEOUS) ×3 IMPLANT
CUFF TOURNIQUET SINGLE 34IN LL (TOURNIQUET CUFF) ×2 IMPLANT
DECANTER SPIKE VIAL GLASS SM (MISCELLANEOUS) ×6 IMPLANT
GAUZE SPONGE 4X4 12PLY STRL (GAUZE/BANDAGES/DRESSINGS) ×3 IMPLANT
GAUZE SPONGE 4X4 16PLY XRAY LF (GAUZE/BANDAGES/DRESSINGS) ×3 IMPLANT
GAUZE XEROFORM 5X9 LF (GAUZE/BANDAGES/DRESSINGS) ×3 IMPLANT
GLOVE BIO SURGEON STRL SZ7 (GLOVE) ×4 IMPLANT
GLOVE BIOGEL PI IND STRL 7.0 (GLOVE) ×1 IMPLANT
GLOVE BIOGEL PI INDICATOR 7.0 (GLOVE) ×6
GLOVE SKINSENSE NS SZ8.0 LF (GLOVE) ×2
GLOVE SKINSENSE STRL SZ8.0 LF (GLOVE) ×1 IMPLANT
GLOVE SS N UNI LF 8.5 STRL (GLOVE) ×3 IMPLANT
GOWN STRL REUS W/TWL LRG LVL3 (GOWN DISPOSABLE) ×3 IMPLANT
GOWN STRL REUS W/TWL XL LVL3 (GOWN DISPOSABLE) ×3 IMPLANT
HLDR LEG FOAM (MISCELLANEOUS) ×1 IMPLANT
IV NS IRRIG 3000ML ARTHROMATIC (IV SOLUTION) ×12 IMPLANT
KIT BLADEGUARD II DBL (SET/KITS/TRAYS/PACK) ×3 IMPLANT
KIT ROOM TURNOVER AP CYSTO (KITS) ×3 IMPLANT
LEG HOLDER FOAM (MISCELLANEOUS) ×2
MANIFOLD NEPTUNE II (INSTRUMENTS) ×3 IMPLANT
MARKER SKIN DUAL TIP RULER LAB (MISCELLANEOUS) ×3 IMPLANT
NDL HYPO 18GX1.5 BLUNT FILL (NEEDLE) ×1 IMPLANT
NDL HYPO 21X1.5 SAFETY (NEEDLE) ×1 IMPLANT
NDL SPNL 18GX3.5 QUINCKE PK (NEEDLE) ×1 IMPLANT
NEEDLE HYPO 18GX1.5 BLUNT FILL (NEEDLE) ×3 IMPLANT
NEEDLE HYPO 21X1.5 SAFETY (NEEDLE) ×3 IMPLANT
NEEDLE SPNL 18GX3.5 QUINCKE PK (NEEDLE) ×3 IMPLANT
NS IRRIG 1000ML POUR BTL (IV SOLUTION) ×3 IMPLANT
PACK ARTHRO LIMB DRAPE STRL (MISCELLANEOUS) ×3 IMPLANT
PAD ABD 5X9 TENDERSORB (GAUZE/BANDAGES/DRESSINGS) ×3 IMPLANT
PAD ARMBOARD 7.5X6 YLW CONV (MISCELLANEOUS) ×3 IMPLANT
PADDING CAST COTTON 6X4 STRL (CAST SUPPLIES) ×3 IMPLANT
SET ARTHROSCOPY INST (INSTRUMENTS) ×3 IMPLANT
SET ARTHROSCOPY PUMP TUBE (IRRIGATION / IRRIGATOR) ×3 IMPLANT
SET BASIN LINEN APH (SET/KITS/TRAYS/PACK) ×3 IMPLANT
SUT ETHILON 3 0 FSL (SUTURE) ×2 IMPLANT
SYR 30ML LL (SYRINGE) ×3 IMPLANT
SYRINGE 10CC LL (SYRINGE) ×3 IMPLANT
WAND 90 DEG TURBOVAC W/CORD (SURGICAL WAND) ×2 IMPLANT
YANKAUER SUCT BULB TIP 10FT TU (MISCELLANEOUS) ×12 IMPLANT

## 2015-07-05 NOTE — Op Note (Signed)
07/05/2015  8:47 AM  PATIENT:  John Huffman  82 y.o. male  PRE-OPERATIVE DIAGNOSIS:  impingement syndrome (synovitis knee) right knee  POST-OPERATIVE DIAGNOSIS:  impingement syndrome (synovitis)  status post right total knee  Operative findings Large impinging lesion (synovial hypertrophy/synovitis) at the superior pole of the patella with impinging lesion also noted in the lateral gutter and in the retropatellar space  Patella tracked normally. Polyethylene showed no extensive or excessive wear or damage   PROCEDURE:  Procedure(s): RIGHT KNEE ARTHROSCOPY WITH EXTENSIVE DEBRIDEMENT sinovectomy major (Right) 3 compartments: Patellofemoral compartment, lateral compartment and retropatellar/notch area  Details of surgical procedure  1. The patient was identified in the preop holding area and after discussion evaluation and update of the chart confirmation of the right knee as a surgical site was done and the surgical site was marked  #2 the patient was taken to surgery for general anesthesia in supine position without any complications  #3 the patient was prepped from the groin to the foot and draped sterilely.  #4 timeout was executed completed and confirmed surgical site  #5 the lateral portal was made the scope was placed into the joint and a diagnostic arthroscopy was performed. Upon immediately entering the patellofemoral space a large impinging lesion was found at the superior pole of patella and upon range of motion evaluation this was crepitance and patellofemoral pain and lesion that was thought to be causing his symptoms. There was also some scarring in the lateral gutter and in the retropatellar space. The polyethylene liner looked normal the notch area had some impinging tissue as well.  Medial portal was established and a shaver was placed into the joint and the lesion was debrided. We then made a superomedial portal and placed an electrocautery device to continue  debridement and maintain hemostasis as the tissue was very vascular. We proceeded to debride the notch area the retropatellar space and the lateral gutter in the same fashion  Patellofemoral tracking was tested and was normal. AP stability was tested and was normal as well.  Portal sites were closed with 3-0 nylon suture 3 and Marcaine with epinephrine was injected into the joint 60 mL  A sterile dressing and Cryo/Cuff were applied. Cryo/Cuff was activated. The patient was extubated and taken to recovery room in stable condition.  SURGEON:  Surgeon(s) and Role:    * Carole Civil, MD - Primary  PHYSICIAN ASSISTANT:   ASSISTANTS: none   ANESTHESIA:   general  EBL:  Total I/O In: 1000 [I.V.:1000] Out: 5 [Blood:5]  BLOOD ADMINISTERED:none  DRAINS: none   LOCAL MEDICATIONS USED:  MARCAINE     SPECIMEN:  No Specimen  DISPOSITION OF SPECIMEN:  N/A  COUNTS:  YES  TOURNIQUET:    DICTATION: .Dragon Dictation  PLAN OF CARE: Discharge to home after PACU  PATIENT DISPOSITION:  PACU - hemodynamically stable.   Delay start of Pharmacological VTE agent (>24hrs) due to surgical blood loss or risk of bleeding: not applicable   postoperative plan The patient will be weightbearing as tolerated. We will add a knee immobilizer for walking. He can start immediate range of motion. A dressing change can be performed in 2 days.

## 2015-07-05 NOTE — Interval H&P Note (Signed)
History and Physical Interval Note:  07/05/2015 7:24 AM  John Huffman  has presented today for surgery, with the diagnosis of impingement syndrome right knee  The various methods of treatment have been discussed with the patient and family. After consideration of risks, benefits and other options for treatment, the patient has consented to  Procedure(s): RIGHT KNEE ARTHROSCOPY WITH DEBRIDEMENT (Right) as a surgical intervention .  The patient's history has been reviewed, patient examined, no change in status, stable for surgery.  I have reviewed the patient's chart and labs.  Questions were answered to the patient's satisfaction.     Arther Abbott

## 2015-07-05 NOTE — Discharge Instructions (Signed)
Use the Cryo/Cuff for 1 hour every 4 hours while you are awake  Use a walker for support when you are walking and applied a knee immobilizer when you are walking, apply as much weight to your leg as you can tolerate  Change  dressing on Saturday apply 3 Band-Aids 1 to each incision   Once the dressing has been changed you can start bending the knee 25-303 times a day

## 2015-07-05 NOTE — Transfer of Care (Signed)
Immediate Anesthesia Transfer of Care Note  Patient: John Huffman  Procedure(s) Performed: Procedure(s): RIGHT KNEE ARTHROSCOPY WITH DEBRIDEMENT (Right)  Patient Location: PACU  Anesthesia Type:General  Level of Consciousness: awake, alert , oriented and patient cooperative  Airway & Oxygen Therapy: Patient Spontanous Breathing and Patient connected to face mask oxygen  Post-op Assessment: Report given to RN and Post -op Vital signs reviewed and stable  Post vital signs: Reviewed and stable  Last Vitals:  Filed Vitals:   07/05/15 0720 07/05/15 0725  BP: 119/68 129/70  Temp:    Resp: 18 0    Complications: No apparent anesthesia complications

## 2015-07-05 NOTE — Anesthesia Preprocedure Evaluation (Addendum)
Anesthesia Evaluation  Patient identified by MRN, date of birth, ID band  Reviewed: Allergy & Precautions, NPO status , Patient's Chart, lab work & pertinent test results  Airway Mallampati: I  TM Distance: >3 FB     Dental  (+) Teeth Intact, Dental Advisory Given   Pulmonary neg pulmonary ROS,    breath sounds clear to auscultation       Cardiovascular hypertension, Pt. on medications  Rhythm:Regular Rate:Normal     Neuro/Psych  Headaches,    GI/Hepatic negative GI ROS,   Endo/Other    Renal/GU      Musculoskeletal  (+) Arthritis ,   Abdominal   Peds  Hematology   Anesthesia Other Findings Hx prostate cancer  Reproductive/Obstetrics                            Anesthesia Physical Anesthesia Plan  ASA: II  Anesthesia Plan: General   Post-op Pain Management:    Induction: Intravenous, Cricoid pressure planned and Rapid sequence  Airway Management Planned: Oral ETT  Additional Equipment:   Intra-op Plan:   Post-operative Plan: Extubation in OR  Informed Consent: I have reviewed the patients History and Physical, chart, labs and discussed the procedure including the risks, benefits and alternatives for the proposed anesthesia with the patient or authorized representative who has indicated his/her understanding and acceptance.     Plan Discussed with: CRNA and Surgeon  Anesthesia Plan Comments: (Coffee sips 1 hr ago,plan GOT-RSI.)       Anesthesia Quick Evaluation

## 2015-07-05 NOTE — Anesthesia Procedure Notes (Signed)
Procedure Name: Intubation Date/Time: 07/05/2015 7:38 AM Performed by: Andree Elk, Tiffanee Mcnee A Pre-anesthesia Checklist: Patient identified, Patient being monitored, Timeout performed, Emergency Drugs available and Suction available Patient Re-evaluated:Patient Re-evaluated prior to inductionOxygen Delivery Method: Circle System Utilized Preoxygenation: Pre-oxygenation with 100% oxygen Intubation Type: IV induction, Rapid sequence and Cricoid Pressure applied Laryngoscope Size: 3 and Miller Grade View: Grade I Tube type: Oral Tube size: 7.0 mm Number of attempts: 1 Airway Equipment and Method: Stylet Placement Confirmation: ETT inserted through vocal cords under direct vision,  positive ETCO2 and breath sounds checked- equal and bilateral Secured at: 21 cm Tube secured with: Tape Dental Injury: Teeth and Oropharynx as per pre-operative assessment

## 2015-07-05 NOTE — Anesthesia Postprocedure Evaluation (Signed)
Anesthesia Post Note  Patient: John Huffman  Procedure(s) Performed: Procedure(s) (LRB): RIGHT KNEE ARTHROSCOPY WITH DEBRIDEMENT (Right)  Patient location during evaluation: PACU Anesthesia Type: General Level of consciousness: awake and alert and oriented Pain management: pain level controlled Vital Signs Assessment: post-procedure vital signs reviewed and stable Respiratory status: spontaneous breathing and respiratory function stable Cardiovascular status: blood pressure returned to baseline Postop Assessment: no signs of nausea or vomiting Anesthetic complications: no    Last Vitals:  Filed Vitals:   07/05/15 0910 07/05/15 0915  BP:  118/65  Pulse: 94 94  Temp:    Resp: 18 17    Last Pain: There were no vitals filed for this visit.               ADAMS, AMY A

## 2015-07-05 NOTE — Brief Op Note (Signed)
07/05/2015  8:47 AM  PATIENT:  John Huffman  82 y.o. male  PRE-OPERATIVE DIAGNOSIS:  impingement syndrome right knee  POST-OPERATIVE DIAGNOSIS:  impingement syndrome status post right total knee  Operative findings Large impinging lesion at the superior pole of the patella with impinging lesion also noted in the lateral gutter and in the retropatellar space  Patella tracked normally. Polyethylene showed no extensive or excessive wear or damage   PROCEDURE:  Procedure(s): RIGHT KNEE ARTHROSCOPY WITH EXTENSIVE DEBRIDEMENT (Right) 3 compartments  Details of surgical procedure  1. The patient was identified in the preop holding area and after discussion evaluation and update of the chart confirmation of the right knee as a surgical site was done and the surgical site was marked  #2 the patient was taken to surgery for general anesthesia in supine position without any complications  #3 the patient was prepped from the groin to the foot and draped sterilely.  #4 timeout was executed completed and confirmed surgical site  #5 the lateral portal was made the scope was placed into the joint and a diagnostic arthroscopy was performed. Upon immediately entering the patellofemoral space a large impinging lesion was found at the superior pole of patella and upon range of motion evaluation this was crepitance and patellofemoral pain and lesion that was thought to be causing his symptoms. There was also some scarring in the lateral gutter and in the retropatellar space. The polyethylene liner looked normal the notch area had some impinging tissue as well.  Medial portal was established and a shaver was placed into the joint and the lesion was debrided. We then made a superomedial portal and placed an electrocautery device to continue debridement and maintain hemostasis as the tissue was very vascular. We proceeded to debride the notch area the retropatellar space and the lateral gutter in the same  fashion  Patellofemoral tracking was tested and was normal. AP stability was tested and was normal as well.  Portal sites were closed with 3-0 nylon suture 3 and Marcaine with epinephrine was injected into the joint 60 mL  A sterile dressing and Cryo/Cuff were applied. Cryo/Cuff was activated. The patient was extubated and taken to recovery room in stable condition.  SURGEON:  Surgeon(s) and Role:    * Carole Civil, MD - Primary  PHYSICIAN ASSISTANT:   ASSISTANTS: none   ANESTHESIA:   general  EBL:  Total I/O In: 1000 [I.V.:1000] Out: 5 [Blood:5]  BLOOD ADMINISTERED:none  DRAINS: none   LOCAL MEDICATIONS USED:  MARCAINE     SPECIMEN:  No Specimen  DISPOSITION OF SPECIMEN:  N/A  COUNTS:  YES  TOURNIQUET:    DICTATION: .Dragon Dictation  PLAN OF CARE: Discharge to home after PACU  PATIENT DISPOSITION:  PACU - hemodynamically stable.   Delay start of Pharmacological VTE agent (>24hrs) due to surgical blood loss or risk of bleeding: not applicable   postoperative plan The patient will be weightbearing as tolerated. We will add a knee immobilizer for walking. He can start immediate range of motion. A dressing change can be performed in 2 days.

## 2015-07-06 ENCOUNTER — Encounter (HOSPITAL_COMMUNITY): Payer: Self-pay | Admitting: Orthopedic Surgery

## 2015-07-13 ENCOUNTER — Ambulatory Visit (INDEPENDENT_AMBULATORY_CARE_PROVIDER_SITE_OTHER): Payer: Medicare Other | Admitting: Orthopedic Surgery

## 2015-07-13 VITALS — BP 123/72 | Ht 72.0 in | Wt 181.0 lb

## 2015-07-13 DIAGNOSIS — Z9889 Other specified postprocedural states: Secondary | ICD-10-CM

## 2015-07-13 DIAGNOSIS — Z96651 Presence of right artificial knee joint: Secondary | ICD-10-CM

## 2015-07-13 DIAGNOSIS — Z4789 Encounter for other orthopedic aftercare: Secondary | ICD-10-CM

## 2015-07-13 NOTE — Progress Notes (Signed)
Chief Complaint  Patient presents with  . Follow-up    Post op #1, [place], DOS 07-05-15.    Status post removal of impingement lesion right total knee. Patient notes is crepitance and pain getting out of the chair have gone he's regained his range of motion he has no signs of infection removed his sutures and I'll see him in a month

## 2015-07-17 ENCOUNTER — Encounter (INDEPENDENT_AMBULATORY_CARE_PROVIDER_SITE_OTHER): Payer: Self-pay | Admitting: Internal Medicine

## 2015-08-13 ENCOUNTER — Encounter: Payer: Self-pay | Admitting: Orthopedic Surgery

## 2015-08-13 ENCOUNTER — Ambulatory Visit: Payer: Medicare Other | Admitting: Orthopedic Surgery

## 2015-08-13 VITALS — BP 136/71 | HR 73 | Ht 72.0 in | Wt 179.0 lb

## 2015-08-13 DIAGNOSIS — Z4789 Encounter for other orthopedic aftercare: Secondary | ICD-10-CM

## 2015-08-13 NOTE — Progress Notes (Signed)
Postop visit #2 status post arthroscopy right knee for impingement lesion after total knee  Surgery date was 07/05/2015 index procedure for total knee was August 2016  Complains of mild crepitance and lateral parapatellar pain and has some pain when he gets out of a chair  Otherwise he's returned to golf he is walking without support.  He says that discomfort is tolerable  We will see him in 2 months to see how much progress is made.

## 2015-09-05 DIAGNOSIS — Z85828 Personal history of other malignant neoplasm of skin: Secondary | ICD-10-CM | POA: Diagnosis not present

## 2015-09-05 DIAGNOSIS — D1801 Hemangioma of skin and subcutaneous tissue: Secondary | ICD-10-CM | POA: Diagnosis not present

## 2015-09-05 DIAGNOSIS — L570 Actinic keratosis: Secondary | ICD-10-CM | POA: Diagnosis not present

## 2015-09-05 DIAGNOSIS — L821 Other seborrheic keratosis: Secondary | ICD-10-CM | POA: Diagnosis not present

## 2015-09-05 DIAGNOSIS — D225 Melanocytic nevi of trunk: Secondary | ICD-10-CM | POA: Diagnosis not present

## 2015-09-27 ENCOUNTER — Ambulatory Visit (INDEPENDENT_AMBULATORY_CARE_PROVIDER_SITE_OTHER): Payer: Medicare Other | Admitting: Internal Medicine

## 2015-10-05 DIAGNOSIS — R9721 Rising PSA following treatment for malignant neoplasm of prostate: Secondary | ICD-10-CM | POA: Diagnosis not present

## 2015-10-05 DIAGNOSIS — R972 Elevated prostate specific antigen [PSA]: Secondary | ICD-10-CM | POA: Diagnosis not present

## 2015-10-05 DIAGNOSIS — C61 Malignant neoplasm of prostate: Secondary | ICD-10-CM | POA: Diagnosis not present

## 2015-10-05 DIAGNOSIS — N320 Bladder-neck obstruction: Secondary | ICD-10-CM | POA: Diagnosis not present

## 2015-10-11 ENCOUNTER — Ambulatory Visit (INDEPENDENT_AMBULATORY_CARE_PROVIDER_SITE_OTHER): Payer: Medicare Other | Admitting: Orthopedic Surgery

## 2015-10-11 ENCOUNTER — Encounter: Payer: Self-pay | Admitting: Orthopedic Surgery

## 2015-10-11 VITALS — BP 121/65 | HR 86 | Ht 72.0 in | Wt 178.2 lb

## 2015-10-11 DIAGNOSIS — Z9889 Other specified postprocedural states: Secondary | ICD-10-CM

## 2015-10-11 DIAGNOSIS — Z96651 Presence of right artificial knee joint: Secondary | ICD-10-CM

## 2015-10-11 DIAGNOSIS — Z4789 Encounter for other orthopedic aftercare: Secondary | ICD-10-CM

## 2015-10-11 NOTE — Progress Notes (Signed)
Chief Complaint  Patient presents with  . Follow-up    SARK 07/05/15 S/P TKA   TKA WAS 12-20-15  The patient complains of a small click in his knee doesn't affect his activity level. He has no pain in the knee  He has flexion of 120 full extension no tenderness no swelling  Recommend annual follow-up x-ray in September

## 2015-10-15 ENCOUNTER — Ambulatory Visit: Payer: Medicare Other | Admitting: Orthopedic Surgery

## 2015-12-07 DIAGNOSIS — E785 Hyperlipidemia, unspecified: Secondary | ICD-10-CM | POA: Diagnosis not present

## 2015-12-07 DIAGNOSIS — N529 Male erectile dysfunction, unspecified: Secondary | ICD-10-CM | POA: Diagnosis not present

## 2015-12-07 DIAGNOSIS — R7301 Impaired fasting glucose: Secondary | ICD-10-CM | POA: Diagnosis not present

## 2015-12-07 DIAGNOSIS — Z79899 Other long term (current) drug therapy: Secondary | ICD-10-CM | POA: Diagnosis not present

## 2016-01-01 DIAGNOSIS — Z23 Encounter for immunization: Secondary | ICD-10-CM | POA: Diagnosis not present

## 2016-01-01 DIAGNOSIS — Z6826 Body mass index (BMI) 26.0-26.9, adult: Secondary | ICD-10-CM | POA: Diagnosis not present

## 2016-01-01 DIAGNOSIS — N401 Enlarged prostate with lower urinary tract symptoms: Secondary | ICD-10-CM | POA: Diagnosis not present

## 2016-01-01 DIAGNOSIS — E785 Hyperlipidemia, unspecified: Secondary | ICD-10-CM | POA: Diagnosis not present

## 2016-01-01 DIAGNOSIS — I10 Essential (primary) hypertension: Secondary | ICD-10-CM | POA: Diagnosis not present

## 2016-01-04 ENCOUNTER — Other Ambulatory Visit (HOSPITAL_COMMUNITY): Payer: Self-pay | Admitting: Internal Medicine

## 2016-01-04 DIAGNOSIS — I350 Nonrheumatic aortic (valve) stenosis: Secondary | ICD-10-CM

## 2016-01-07 ENCOUNTER — Ambulatory Visit (INDEPENDENT_AMBULATORY_CARE_PROVIDER_SITE_OTHER): Payer: Medicare Other

## 2016-01-07 ENCOUNTER — Ambulatory Visit (INDEPENDENT_AMBULATORY_CARE_PROVIDER_SITE_OTHER): Payer: Medicare Other | Admitting: Orthopedic Surgery

## 2016-01-07 VITALS — BP 118/72 | HR 83 | Ht 72.0 in | Wt 179.0 lb

## 2016-01-07 DIAGNOSIS — M129 Arthropathy, unspecified: Secondary | ICD-10-CM

## 2016-01-07 DIAGNOSIS — Z96651 Presence of right artificial knee joint: Secondary | ICD-10-CM

## 2016-01-07 DIAGNOSIS — M1710 Unilateral primary osteoarthritis, unspecified knee: Secondary | ICD-10-CM

## 2016-01-07 NOTE — Progress Notes (Signed)
Patient ID: John Huffman, male   DOB: Aug 11, 1928, 82 y.o.   MRN: [id number]  Post op annual TKA   Chief Complaint  Patient presents with  . Follow-up    3 month recheck on right knee replacement, with yearly xray. DOS 12-20-14.   March 2016 we also did a patellar clunk debridement arthroscopically.  HPI John Huffman is a 82 y.o. male.  Complains of a click and difficulty getting out of a chair.  He does not complain of pain   Past Medical History:  Diagnosis Date  . Arthritis   . Cancer ([place])    Proatate  . High cholesterol   . Hypertension     Past Surgical History:  Procedure Laterality Date  . APPENDECTOMY    . BACK SURGERY    . KNEE ARTHROSCOPY Right 07/05/2015   Procedure: RIGHT KNEE ARTHROSCOPY WITH DEBRIDEMENT;  Surgeon: Carole Civil, MD;  Location: [facility];  Service: Orthopedics;  Laterality: Right;  . TOTAL KNEE ARTHROPLASTY Right 12/20/2014   Procedure: TOTAL KNEE ARTHROPLASTY;  Surgeon: Carole Civil, MD;  Location: [facility];  Service: Orthopedics;  Laterality: Right;  Marland Kitchen VASECTOMY       No Known Allergies  Current Outpatient Prescriptions  Medication Sig Dispense Refill  . amLODipine (NORVASC) 5 MG tablet Take 5 mg by mouth daily.    Marland Kitchen losartan (COZAAR) 100 MG tablet Take 100 mg by mouth daily.    . simvastatin (ZOCOR) 40 MG tablet Take 40 mg by mouth daily.    . Tamsulosin HCl (FLOMAX) 0.4 MG CAPS Take 0.4 mg by mouth daily.      No current facility-administered medications for this visit.     Review of Systems Review of Systems   Physical Exam Blood pressure 118/72, pulse 83, height 6' (1.829 m), weight 179 lb (81.2 kg).   Gen. appearance is normal there are no congenital abnormalities   The patient is oriented 3   Mood and affect are normal   Ambulation is without assistive device   Knee inspection reveals a well-healed incision with no swelling   Knee flexion 130 with full extension  Stability in the anteroposterior plane is  normal as well as in the medial lateral plane  Motor exam reveals full extension without extensor lag   Data Reviewed KNEE XRAYS : Stable no loosening   Assessment S/P right TKA   Plan    One year x-ray again       Arther Abbott 01/07/2016, 11:14 AM

## 2016-01-10 DIAGNOSIS — R972 Elevated prostate specific antigen [PSA]: Secondary | ICD-10-CM | POA: Diagnosis not present

## 2016-01-11 ENCOUNTER — Ambulatory Visit (HOSPITAL_COMMUNITY)
Admission: RE | Admit: 2016-01-11 | Discharge: 2016-01-11 | Disposition: A | Discharge status: Home / Self Care | Payer: Medicare Other | Source: Ambulatory Visit | Attending: Internal Medicine | Admitting: Internal Medicine

## 2016-01-11 DIAGNOSIS — I517 Cardiomegaly: Secondary | ICD-10-CM | POA: Diagnosis not present

## 2016-01-11 DIAGNOSIS — I350 Nonrheumatic aortic (valve) stenosis: Secondary | ICD-10-CM | POA: Diagnosis not present

## 2016-01-11 DIAGNOSIS — I700 Atherosclerosis of aorta: Secondary | ICD-10-CM | POA: Diagnosis not present

## 2016-01-11 LAB — ECHOCARDIOGRAM COMPLETE
AO mean calculated velocity dopler: 204 cm/s
AOVTI: 75.1 cm
AV Area VTI index: 0.78 cm2/m2
AV Area mean vel: 1.45 cm2
AV area mean vel ind: 0.71 cm2/m2
AVA: 1.58 cm2
AVAREAVTI: 1.43 cm2
AVCELMEANRAT: 0.42
AVG: 21 mmHg
AVLVOTPG: 7 mmHg
AVPG: 44 mmHg
AVPKVEL: 330 cm/s
Ao pk vel: 0.41 m/s
CHL CUP AV PEAK INDEX: 0.7
CHL CUP AV VEL: 1.58
CHL CUP MV DEC (S): 380
CHL CUP RV SYS PRESS: 27 mmHg
CHL CUP STROKE VOLUME: 46 mL
E decel time: 380 msec
EERAT: 9.85
FS: 48 % — AB (ref 28–44)
IV/PV OW: 0.83
LA ID, A-P, ES: 38 mm
LA diam end sys: 38 mm
LA vol A4C: 79.6 ml
LA vol index: 42.3 mL/m2
LA vol: 86.1 mL
LADIAMINDEX: 1.87 cm/m2
LDCA: 3.46 cm2
LV PW d: 16.3 mm — AB (ref 0.6–1.1)
LV SIMPSON'S DISK: 70
LV TDI E'LATERAL: 8.27
LV TDI E'MEDIAL: 6.42
LV dias vol: 65 mL (ref 62–150)
LV e' LATERAL: 8.27 cm/s
LV sys vol index: 10 mL/m2
LV sys vol: 20 mL — AB
LVDIAVOLIN: 32 mL/m2
LVEEAVG: 9.85
LVEEMED: 9.85
LVOT VTI: 34.2 cm
LVOT peak VTI: 0.46 cm
LVOT peak vel: 136 cm/s
LVOTD: 21 mm
LVOTSV: 118 mL
MV pk A vel: 136 m/s
MVPG: 3 mmHg
MVPKEVEL: 81.5 m/s
RV LATERAL S' VELOCITY: 16.3 cm/s
Reg peak vel: 243 cm/s
TAPSE: 27.2 mm
TR max vel: 243 cm/s
Valve area index: 0.78

## 2016-01-11 NOTE — Progress Notes (Signed)
*  PRELIMINARY RESULTS* Echocardiogram 2D Echocardiogram has been performed.  Samuel Germany 01/11/2016, 2:53 PM

## 2016-02-20 DIAGNOSIS — Z23 Encounter for immunization: Secondary | ICD-10-CM | POA: Diagnosis not present

## 2016-03-10 DIAGNOSIS — D1801 Hemangioma of skin and subcutaneous tissue: Secondary | ICD-10-CM | POA: Diagnosis not present

## 2016-03-10 DIAGNOSIS — D225 Melanocytic nevi of trunk: Secondary | ICD-10-CM | POA: Diagnosis not present

## 2016-03-10 DIAGNOSIS — L821 Other seborrheic keratosis: Secondary | ICD-10-CM | POA: Diagnosis not present

## 2016-03-10 DIAGNOSIS — Z85828 Personal history of other malignant neoplasm of skin: Secondary | ICD-10-CM | POA: Diagnosis not present

## 2016-03-10 DIAGNOSIS — D485 Neoplasm of uncertain behavior of skin: Secondary | ICD-10-CM | POA: Diagnosis not present

## 2016-03-10 DIAGNOSIS — C44311 Basal cell carcinoma of skin of nose: Secondary | ICD-10-CM | POA: Diagnosis not present

## 2016-03-10 DIAGNOSIS — L570 Actinic keratosis: Secondary | ICD-10-CM | POA: Diagnosis not present

## 2016-04-04 DIAGNOSIS — R972 Elevated prostate specific antigen [PSA]: Secondary | ICD-10-CM | POA: Diagnosis not present

## 2016-04-04 DIAGNOSIS — N320 Bladder-neck obstruction: Secondary | ICD-10-CM | POA: Diagnosis not present

## 2016-04-04 DIAGNOSIS — C61 Malignant neoplasm of prostate: Secondary | ICD-10-CM | POA: Diagnosis not present

## 2016-04-30 DIAGNOSIS — C44311 Basal cell carcinoma of skin of nose: Secondary | ICD-10-CM | POA: Diagnosis not present

## 2016-04-30 DIAGNOSIS — Z85828 Personal history of other malignant neoplasm of skin: Secondary | ICD-10-CM | POA: Diagnosis not present

## 2016-05-12 ENCOUNTER — Telehealth: Payer: Self-pay | Admitting: Orthopedic Surgery

## 2016-05-12 NOTE — Telephone Encounter (Signed)
Missy from [facility] called needing to know if the patient's pre-med is for 1 year or lifetime, since he had knee surgery.  Please call and advise [phone]

## 2016-05-12 NOTE — Telephone Encounter (Signed)
I called [facility] and spoke with [place]. I relayed the message that [facility] sent back to me.

## 2016-05-12 NOTE — Telephone Encounter (Signed)
MANY YEARS

## 2016-06-30 DIAGNOSIS — I350 Nonrheumatic aortic (valve) stenosis: Secondary | ICD-10-CM | POA: Diagnosis not present

## 2016-06-30 DIAGNOSIS — Z6826 Body mass index (BMI) 26.0-26.9, adult: Secondary | ICD-10-CM | POA: Diagnosis not present

## 2016-06-30 DIAGNOSIS — I10 Essential (primary) hypertension: Secondary | ICD-10-CM | POA: Diagnosis not present

## 2016-06-30 DIAGNOSIS — E785 Hyperlipidemia, unspecified: Secondary | ICD-10-CM | POA: Diagnosis not present

## 2016-07-08 DIAGNOSIS — H02132 Senile ectropion of right lower eyelid: Secondary | ICD-10-CM | POA: Diagnosis not present

## 2016-07-08 DIAGNOSIS — H02135 Senile ectropion of left lower eyelid: Secondary | ICD-10-CM | POA: Diagnosis not present

## 2016-07-14 DIAGNOSIS — C61 Malignant neoplasm of prostate: Secondary | ICD-10-CM | POA: Diagnosis not present

## 2016-07-14 DIAGNOSIS — R972 Elevated prostate specific antigen [PSA]: Secondary | ICD-10-CM | POA: Diagnosis not present

## 2016-08-07 ENCOUNTER — Ambulatory Visit (INDEPENDENT_AMBULATORY_CARE_PROVIDER_SITE_OTHER): Payer: Medicare Other | Admitting: Otolaryngology

## 2016-08-07 DIAGNOSIS — H903 Sensorineural hearing loss, bilateral: Secondary | ICD-10-CM

## 2016-08-07 DIAGNOSIS — H6123 Impacted cerumen, bilateral: Secondary | ICD-10-CM

## 2016-09-08 DIAGNOSIS — C44712 Basal cell carcinoma of skin of right lower limb, including hip: Secondary | ICD-10-CM | POA: Diagnosis not present

## 2016-09-08 DIAGNOSIS — L821 Other seborrheic keratosis: Secondary | ICD-10-CM | POA: Diagnosis not present

## 2016-09-08 DIAGNOSIS — Z85828 Personal history of other malignant neoplasm of skin: Secondary | ICD-10-CM | POA: Diagnosis not present

## 2016-09-08 DIAGNOSIS — D225 Melanocytic nevi of trunk: Secondary | ICD-10-CM | POA: Diagnosis not present

## 2016-09-08 DIAGNOSIS — C44311 Basal cell carcinoma of skin of nose: Secondary | ICD-10-CM | POA: Diagnosis not present

## 2016-09-08 DIAGNOSIS — D485 Neoplasm of uncertain behavior of skin: Secondary | ICD-10-CM | POA: Diagnosis not present

## 2016-09-22 DIAGNOSIS — R9721 Rising PSA following treatment for malignant neoplasm of prostate: Secondary | ICD-10-CM | POA: Diagnosis not present

## 2016-09-22 DIAGNOSIS — C61 Malignant neoplasm of prostate: Secondary | ICD-10-CM | POA: Diagnosis not present

## 2016-09-22 DIAGNOSIS — N320 Bladder-neck obstruction: Secondary | ICD-10-CM | POA: Diagnosis not present

## 2016-09-22 DIAGNOSIS — R972 Elevated prostate specific antigen [PSA]: Secondary | ICD-10-CM | POA: Diagnosis not present

## 2017-01-05 DIAGNOSIS — E785 Hyperlipidemia, unspecified: Secondary | ICD-10-CM | POA: Diagnosis not present

## 2017-01-05 DIAGNOSIS — Z125 Encounter for screening for malignant neoplasm of prostate: Secondary | ICD-10-CM | POA: Diagnosis not present

## 2017-01-05 DIAGNOSIS — I10 Essential (primary) hypertension: Secondary | ICD-10-CM | POA: Diagnosis not present

## 2017-01-05 DIAGNOSIS — I350 Nonrheumatic aortic (valve) stenosis: Secondary | ICD-10-CM | POA: Diagnosis not present

## 2017-01-05 DIAGNOSIS — Z79899 Other long term (current) drug therapy: Secondary | ICD-10-CM | POA: Diagnosis not present

## 2017-01-07 ENCOUNTER — Ambulatory Visit (INDEPENDENT_AMBULATORY_CARE_PROVIDER_SITE_OTHER): Payer: Medicare Other

## 2017-01-07 ENCOUNTER — Encounter: Payer: Self-pay | Admitting: Orthopedic Surgery

## 2017-01-07 ENCOUNTER — Ambulatory Visit (INDEPENDENT_AMBULATORY_CARE_PROVIDER_SITE_OTHER): Payer: Medicare Other | Admitting: Orthopedic Surgery

## 2017-01-07 VITALS — BP 135/69 | HR 74 | Ht 72.0 in | Wt 180.0 lb

## 2017-01-07 DIAGNOSIS — Z96651 Presence of right artificial knee joint: Secondary | ICD-10-CM | POA: Diagnosis not present

## 2017-01-07 NOTE — Progress Notes (Signed)
ANNUAL FOLLOW UP FOR  right TKA   Chief Complaint  Patient presents with  . Follow-up    Annual follow up for Right TKA DOS 12/20/14     HPI: The patient is here for the annual  follow-up x-ray for knee replacement. The patient is not complaining of pain weakness instability or stiffness in the repaired knee.   Review of Systems  Musculoskeletal:       Right knee clicking  Neurological:       Mr. Dermody fell in January since that time he's had some numbness in his left foot and lateral portion of his left leg and some clicking in his right knee    Past Medical History:  Diagnosis Date  . Arthritis   . Cancer ([place])    Proatate  . High cholesterol   . Hypertension     Physical Exam Examination of the Right KNEE  BP 135/69   Pulse 74   Ht 6' (1.829 m)   Wt 180 lb (81.6 kg)   BMI 24.41 kg/m   General the patient is normally groomed in no distress  Mood normal Affect pleasant   The patient is Awake and alert ; oriented normal   Inspection shows : incision healed nicely without erythema, no tenderness no swelling  Range of motion total range of motion is 120  Stability the knee is stable anterior to posterior as well as medial to lateral  Strength quadriceps strength is normal  Skin no erythema around the skin incision  Cardiovascular NO EDEMA   Neuro: normal sensation in the operative leg   Gait: normal expected gait without cane    Medical decision-making section  X-rays ordered with the following personal interpretation  Normal alignment without loosening   Diagnosis  Encounter Diagnosis  Name Primary?  . Status post right knee replacement Yes     Plan follow-up as needed

## 2017-01-08 DIAGNOSIS — Z6825 Body mass index (BMI) 25.0-25.9, adult: Secondary | ICD-10-CM | POA: Diagnosis not present

## 2017-01-08 DIAGNOSIS — I700 Atherosclerosis of aorta: Secondary | ICD-10-CM | POA: Diagnosis not present

## 2017-01-08 DIAGNOSIS — R413 Other amnesia: Secondary | ICD-10-CM | POA: Diagnosis not present

## 2017-01-08 DIAGNOSIS — Z23 Encounter for immunization: Secondary | ICD-10-CM | POA: Diagnosis not present

## 2017-01-08 DIAGNOSIS — C61 Malignant neoplasm of prostate: Secondary | ICD-10-CM | POA: Diagnosis not present

## 2017-03-18 DIAGNOSIS — C4441 Basal cell carcinoma of skin of scalp and neck: Secondary | ICD-10-CM | POA: Diagnosis not present

## 2017-03-18 DIAGNOSIS — L821 Other seborrheic keratosis: Secondary | ICD-10-CM | POA: Diagnosis not present

## 2017-03-18 DIAGNOSIS — C44319 Basal cell carcinoma of skin of other parts of face: Secondary | ICD-10-CM | POA: Diagnosis not present

## 2017-03-18 DIAGNOSIS — D485 Neoplasm of uncertain behavior of skin: Secondary | ICD-10-CM | POA: Diagnosis not present

## 2017-03-18 DIAGNOSIS — Z85828 Personal history of other malignant neoplasm of skin: Secondary | ICD-10-CM | POA: Diagnosis not present

## 2017-03-18 DIAGNOSIS — D1801 Hemangioma of skin and subcutaneous tissue: Secondary | ICD-10-CM | POA: Diagnosis not present

## 2017-03-27 DIAGNOSIS — C61 Malignant neoplasm of prostate: Secondary | ICD-10-CM | POA: Diagnosis not present

## 2017-03-27 DIAGNOSIS — N320 Bladder-neck obstruction: Secondary | ICD-10-CM | POA: Diagnosis not present

## 2017-03-27 DIAGNOSIS — R9721 Rising PSA following treatment for malignant neoplasm of prostate: Secondary | ICD-10-CM | POA: Diagnosis not present

## 2017-03-27 DIAGNOSIS — R972 Elevated prostate specific antigen [PSA]: Secondary | ICD-10-CM | POA: Diagnosis not present

## 2017-04-08 DIAGNOSIS — R413 Other amnesia: Secondary | ICD-10-CM | POA: Diagnosis not present

## 2017-04-08 DIAGNOSIS — I10 Essential (primary) hypertension: Secondary | ICD-10-CM | POA: Diagnosis not present

## 2017-08-12 DIAGNOSIS — I10 Essential (primary) hypertension: Secondary | ICD-10-CM | POA: Diagnosis not present

## 2017-08-12 DIAGNOSIS — I350 Nonrheumatic aortic (valve) stenosis: Secondary | ICD-10-CM | POA: Diagnosis not present

## 2017-08-12 DIAGNOSIS — G3184 Mild cognitive impairment, so stated: Secondary | ICD-10-CM | POA: Diagnosis not present

## 2017-09-16 DIAGNOSIS — L821 Other seborrheic keratosis: Secondary | ICD-10-CM | POA: Diagnosis not present

## 2017-09-16 DIAGNOSIS — D1801 Hemangioma of skin and subcutaneous tissue: Secondary | ICD-10-CM | POA: Diagnosis not present

## 2017-09-16 DIAGNOSIS — D485 Neoplasm of uncertain behavior of skin: Secondary | ICD-10-CM | POA: Diagnosis not present

## 2017-09-16 DIAGNOSIS — L570 Actinic keratosis: Secondary | ICD-10-CM | POA: Diagnosis not present

## 2017-09-16 DIAGNOSIS — Z85828 Personal history of other malignant neoplasm of skin: Secondary | ICD-10-CM | POA: Diagnosis not present

## 2017-09-16 DIAGNOSIS — D225 Melanocytic nevi of trunk: Secondary | ICD-10-CM | POA: Diagnosis not present

## 2017-09-25 DIAGNOSIS — C61 Malignant neoplasm of prostate: Secondary | ICD-10-CM | POA: Diagnosis not present

## 2017-09-25 DIAGNOSIS — K4090 Unilateral inguinal hernia, without obstruction or gangrene, not specified as recurrent: Secondary | ICD-10-CM | POA: Diagnosis not present

## 2017-09-25 DIAGNOSIS — R9721 Rising PSA following treatment for malignant neoplasm of prostate: Secondary | ICD-10-CM | POA: Diagnosis not present

## 2017-09-25 DIAGNOSIS — R972 Elevated prostate specific antigen [PSA]: Secondary | ICD-10-CM | POA: Diagnosis not present

## 2018-01-12 DIAGNOSIS — M1990 Unspecified osteoarthritis, unspecified site: Secondary | ICD-10-CM | POA: Diagnosis not present

## 2018-01-12 DIAGNOSIS — E875 Hyperkalemia: Secondary | ICD-10-CM | POA: Diagnosis not present

## 2018-01-12 DIAGNOSIS — D075 Carcinoma in situ of prostate: Secondary | ICD-10-CM | POA: Diagnosis not present

## 2018-01-12 DIAGNOSIS — Z79899 Other long term (current) drug therapy: Secondary | ICD-10-CM | POA: Diagnosis not present

## 2018-01-12 DIAGNOSIS — R413 Other amnesia: Secondary | ICD-10-CM | POA: Diagnosis not present

## 2018-01-12 DIAGNOSIS — I10 Essential (primary) hypertension: Secondary | ICD-10-CM | POA: Diagnosis not present

## 2018-01-12 DIAGNOSIS — E785 Hyperlipidemia, unspecified: Secondary | ICD-10-CM | POA: Diagnosis not present

## 2018-01-19 DIAGNOSIS — Z23 Encounter for immunization: Secondary | ICD-10-CM | POA: Diagnosis not present

## 2018-01-19 DIAGNOSIS — R413 Other amnesia: Secondary | ICD-10-CM | POA: Diagnosis not present

## 2018-01-19 DIAGNOSIS — I350 Nonrheumatic aortic (valve) stenosis: Secondary | ICD-10-CM | POA: Diagnosis not present

## 2018-01-19 DIAGNOSIS — Z6825 Body mass index (BMI) 25.0-25.9, adult: Secondary | ICD-10-CM | POA: Diagnosis not present

## 2018-01-19 DIAGNOSIS — I10 Essential (primary) hypertension: Secondary | ICD-10-CM | POA: Diagnosis not present

## 2018-01-19 DIAGNOSIS — C61 Malignant neoplasm of prostate: Secondary | ICD-10-CM | POA: Diagnosis not present

## 2018-01-25 ENCOUNTER — Other Ambulatory Visit (HOSPITAL_COMMUNITY): Payer: Self-pay | Admitting: Internal Medicine

## 2018-01-25 ENCOUNTER — Ambulatory Visit (HOSPITAL_COMMUNITY)
Admission: RE | Admit: 2018-01-25 | Discharge: 2018-01-25 | Disposition: A | Discharge status: Home / Self Care | Payer: Medicare Other | Source: Ambulatory Visit | Attending: Internal Medicine | Admitting: Internal Medicine

## 2018-01-25 DIAGNOSIS — I350 Nonrheumatic aortic (valve) stenosis: Secondary | ICD-10-CM

## 2018-01-25 DIAGNOSIS — I080 Rheumatic disorders of both mitral and aortic valves: Secondary | ICD-10-CM | POA: Diagnosis not present

## 2018-01-25 DIAGNOSIS — I10 Essential (primary) hypertension: Secondary | ICD-10-CM | POA: Diagnosis not present

## 2018-01-25 DIAGNOSIS — E7800 Pure hypercholesterolemia, unspecified: Secondary | ICD-10-CM | POA: Insufficient documentation

## 2018-01-25 NOTE — Progress Notes (Signed)
*  PRELIMINARY RESULTS* Echocardiogram 2D Echocardiogram has been performed.  Samuel Germany 01/25/2018, 12:21 PM

## 2018-03-03 DIAGNOSIS — D225 Melanocytic nevi of trunk: Secondary | ICD-10-CM | POA: Diagnosis not present

## 2018-03-03 DIAGNOSIS — Z85828 Personal history of other malignant neoplasm of skin: Secondary | ICD-10-CM | POA: Diagnosis not present

## 2018-03-03 DIAGNOSIS — L570 Actinic keratosis: Secondary | ICD-10-CM | POA: Diagnosis not present

## 2018-03-03 DIAGNOSIS — L821 Other seborrheic keratosis: Secondary | ICD-10-CM | POA: Diagnosis not present

## 2018-03-29 DIAGNOSIS — R972 Elevated prostate specific antigen [PSA]: Secondary | ICD-10-CM | POA: Diagnosis not present

## 2018-03-29 DIAGNOSIS — R9721 Rising PSA following treatment for malignant neoplasm of prostate: Secondary | ICD-10-CM | POA: Diagnosis not present

## 2018-03-29 DIAGNOSIS — R3911 Hesitancy of micturition: Secondary | ICD-10-CM | POA: Diagnosis not present

## 2018-03-29 DIAGNOSIS — C61 Malignant neoplasm of prostate: Secondary | ICD-10-CM | POA: Diagnosis not present

## 2018-05-26 DIAGNOSIS — G309 Alzheimer's disease, unspecified: Secondary | ICD-10-CM | POA: Diagnosis not present

## 2018-05-26 DIAGNOSIS — I10 Essential (primary) hypertension: Secondary | ICD-10-CM | POA: Diagnosis not present

## 2018-05-26 DIAGNOSIS — I350 Nonrheumatic aortic (valve) stenosis: Secondary | ICD-10-CM | POA: Diagnosis not present

## 2018-05-27 DIAGNOSIS — H6123 Impacted cerumen, bilateral: Secondary | ICD-10-CM | POA: Diagnosis not present

## 2019-01-24 DIAGNOSIS — Z23 Encounter for immunization: Secondary | ICD-10-CM | POA: Diagnosis not present

## 2019-02-24 DIAGNOSIS — G3184 Mild cognitive impairment, so stated: Secondary | ICD-10-CM | POA: Diagnosis not present

## 2019-02-24 DIAGNOSIS — E785 Hyperlipidemia, unspecified: Secondary | ICD-10-CM | POA: Diagnosis not present

## 2019-02-24 DIAGNOSIS — R011 Cardiac murmur, unspecified: Secondary | ICD-10-CM | POA: Diagnosis not present

## 2019-02-24 DIAGNOSIS — Z0189 Encounter for other specified special examinations: Secondary | ICD-10-CM | POA: Diagnosis not present

## 2019-02-24 DIAGNOSIS — Z8546 Personal history of malignant neoplasm of prostate: Secondary | ICD-10-CM | POA: Diagnosis not present

## 2019-02-24 DIAGNOSIS — I10 Essential (primary) hypertension: Secondary | ICD-10-CM | POA: Diagnosis not present

## 2019-03-22 DIAGNOSIS — Z1329 Encounter for screening for other suspected endocrine disorder: Secondary | ICD-10-CM | POA: Diagnosis not present

## 2019-03-22 DIAGNOSIS — E785 Hyperlipidemia, unspecified: Secondary | ICD-10-CM | POA: Diagnosis not present

## 2019-03-22 DIAGNOSIS — I10 Essential (primary) hypertension: Secondary | ICD-10-CM | POA: Diagnosis not present

## 2019-03-24 DIAGNOSIS — R011 Cardiac murmur, unspecified: Secondary | ICD-10-CM | POA: Diagnosis not present

## 2019-03-24 DIAGNOSIS — I10 Essential (primary) hypertension: Secondary | ICD-10-CM | POA: Diagnosis not present

## 2019-03-24 DIAGNOSIS — G3184 Mild cognitive impairment, so stated: Secondary | ICD-10-CM | POA: Diagnosis not present

## 2019-03-24 DIAGNOSIS — Z8546 Personal history of malignant neoplasm of prostate: Secondary | ICD-10-CM | POA: Diagnosis not present

## 2019-03-24 DIAGNOSIS — E870 Hyperosmolality and hypernatremia: Secondary | ICD-10-CM | POA: Diagnosis not present

## 2019-03-24 DIAGNOSIS — E785 Hyperlipidemia, unspecified: Secondary | ICD-10-CM | POA: Diagnosis not present

## 2019-03-24 DIAGNOSIS — L989 Disorder of the skin and subcutaneous tissue, unspecified: Secondary | ICD-10-CM | POA: Diagnosis not present

## 2019-06-08 DIAGNOSIS — G3184 Mild cognitive impairment, so stated: Secondary | ICD-10-CM | POA: Diagnosis not present

## 2019-06-08 DIAGNOSIS — F0391 Unspecified dementia with behavioral disturbance: Secondary | ICD-10-CM | POA: Diagnosis not present

## 2019-06-08 DIAGNOSIS — H6121 Impacted cerumen, right ear: Secondary | ICD-10-CM | POA: Diagnosis not present

## 2019-09-14 DIAGNOSIS — I10 Essential (primary) hypertension: Secondary | ICD-10-CM | POA: Diagnosis not present

## 2019-09-14 DIAGNOSIS — E785 Hyperlipidemia, unspecified: Secondary | ICD-10-CM | POA: Diagnosis not present

## 2019-09-20 ENCOUNTER — Encounter: Payer: Self-pay | Admitting: Podiatry

## 2019-09-20 ENCOUNTER — Other Ambulatory Visit: Payer: Self-pay

## 2019-09-20 ENCOUNTER — Ambulatory Visit (INDEPENDENT_AMBULATORY_CARE_PROVIDER_SITE_OTHER): Payer: Medicare Other | Admitting: Podiatry

## 2019-09-20 DIAGNOSIS — M79675 Pain in left toe(s): Secondary | ICD-10-CM | POA: Diagnosis not present

## 2019-09-20 DIAGNOSIS — L603 Nail dystrophy: Secondary | ICD-10-CM | POA: Diagnosis not present

## 2019-09-20 DIAGNOSIS — B351 Tinea unguium: Secondary | ICD-10-CM | POA: Insufficient documentation

## 2019-09-20 DIAGNOSIS — M79674 Pain in right toe(s): Secondary | ICD-10-CM | POA: Diagnosis not present

## 2019-09-20 NOTE — Progress Notes (Signed)
This patient presents  to the office for evaluation and treatment of long thick painful nails .  This patient is unable to trim his own nails since the patient cannot reach his feet.  Patient says the nails are painful walking and wearing his shoes.  He also sas he has less feeling in his left foot and has inability to move his big toe left foot. He returns for preventive foot care services. He presents to the office with his wife.  General Appearance  Alert, conversant and in no acute stress.  Vascular  Dorsalis pedis and posterior tibial  pulses are palpable  bilaterally.  Capillary return is within normal limits  bilaterally. Temperature is within normal limits  bilaterally.  Neurologic  Senn-Weinstein monofilament wire test within normal limits  bilaterally. Muscle power within normal limits bilaterally.  Nails Thick disfigured discolored nails with subungual debris  from hallux to three  toes bilaterally. No evidence of bacterial infection or drainage bilaterally.  Orthopedic  No limitations of motion  feet .  No crepitus or effusions noted.  No bony pathology or digital deformities noted. Minimal df/pf great toe left foot.  Skin  normotropic skin with no porokeratosis noted bilaterally.  No signs of infections or ulcers noted.     Onychomycosis  Pain in toes right foot  Pain in toes left foot  Lack ROM left hallux.  Debridement  of nails  1-5  B/L with a nail nipper.  Nails were then filed using a dremel tool with no incidents. Discussed his   loss of motion of his big toe left foot  RTC prn.   Gardiner Barefoot DPM

## 2019-09-21 DIAGNOSIS — Z8546 Personal history of malignant neoplasm of prostate: Secondary | ICD-10-CM | POA: Diagnosis not present

## 2019-09-21 DIAGNOSIS — E870 Hyperosmolality and hypernatremia: Secondary | ICD-10-CM | POA: Diagnosis not present

## 2019-09-21 DIAGNOSIS — E785 Hyperlipidemia, unspecified: Secondary | ICD-10-CM | POA: Diagnosis not present

## 2019-09-21 DIAGNOSIS — I10 Essential (primary) hypertension: Secondary | ICD-10-CM | POA: Diagnosis not present

## 2019-09-21 DIAGNOSIS — L989 Disorder of the skin and subcutaneous tissue, unspecified: Secondary | ICD-10-CM | POA: Diagnosis not present

## 2019-09-21 DIAGNOSIS — R011 Cardiac murmur, unspecified: Secondary | ICD-10-CM | POA: Diagnosis not present

## 2019-09-21 DIAGNOSIS — G3184 Mild cognitive impairment, so stated: Secondary | ICD-10-CM | POA: Diagnosis not present

## 2020-01-14 DIAGNOSIS — Z23 Encounter for immunization: Secondary | ICD-10-CM | POA: Diagnosis not present

## 2020-09-20 DIAGNOSIS — I10 Essential (primary) hypertension: Secondary | ICD-10-CM | POA: Diagnosis not present

## 2020-09-25 DIAGNOSIS — G3184 Mild cognitive impairment, so stated: Secondary | ICD-10-CM | POA: Diagnosis not present

## 2020-09-25 DIAGNOSIS — E785 Hyperlipidemia, unspecified: Secondary | ICD-10-CM | POA: Diagnosis not present

## 2020-09-25 DIAGNOSIS — I10 Essential (primary) hypertension: Secondary | ICD-10-CM | POA: Diagnosis not present

## 2020-09-25 DIAGNOSIS — Z8546 Personal history of malignant neoplasm of prostate: Secondary | ICD-10-CM | POA: Diagnosis not present

## 2020-09-25 DIAGNOSIS — R011 Cardiac murmur, unspecified: Secondary | ICD-10-CM | POA: Diagnosis not present

## 2020-09-25 DIAGNOSIS — L989 Disorder of the skin and subcutaneous tissue, unspecified: Secondary | ICD-10-CM | POA: Diagnosis not present

## 2020-11-29 DIAGNOSIS — L720 Epidermal cyst: Secondary | ICD-10-CM | POA: Diagnosis not present

## 2020-11-29 DIAGNOSIS — C4441 Basal cell carcinoma of skin of scalp and neck: Secondary | ICD-10-CM | POA: Diagnosis not present

## 2020-11-29 DIAGNOSIS — Z85828 Personal history of other malignant neoplasm of skin: Secondary | ICD-10-CM | POA: Diagnosis not present

## 2020-11-29 DIAGNOSIS — L570 Actinic keratosis: Secondary | ICD-10-CM | POA: Diagnosis not present

## 2021-02-08 DIAGNOSIS — Z23 Encounter for immunization: Secondary | ICD-10-CM | POA: Diagnosis not present

## 2021-09-25 DIAGNOSIS — E785 Hyperlipidemia, unspecified: Secondary | ICD-10-CM | POA: Diagnosis not present

## 2021-09-25 DIAGNOSIS — Z8546 Personal history of malignant neoplasm of prostate: Secondary | ICD-10-CM | POA: Diagnosis not present

## 2021-09-25 DIAGNOSIS — R7301 Impaired fasting glucose: Secondary | ICD-10-CM | POA: Diagnosis not present

## 2021-10-01 DIAGNOSIS — C61 Malignant neoplasm of prostate: Secondary | ICD-10-CM | POA: Diagnosis not present

## 2021-10-01 DIAGNOSIS — R011 Cardiac murmur, unspecified: Secondary | ICD-10-CM | POA: Diagnosis not present

## 2021-10-01 DIAGNOSIS — Z0000 Encounter for general adult medical examination without abnormal findings: Secondary | ICD-10-CM | POA: Diagnosis not present

## 2021-10-01 DIAGNOSIS — Z0001 Encounter for general adult medical examination with abnormal findings: Secondary | ICD-10-CM | POA: Diagnosis not present

## 2021-10-01 DIAGNOSIS — E785 Hyperlipidemia, unspecified: Secondary | ICD-10-CM | POA: Diagnosis not present

## 2021-10-01 DIAGNOSIS — I10 Essential (primary) hypertension: Secondary | ICD-10-CM | POA: Diagnosis not present

## 2021-10-01 DIAGNOSIS — E440 Moderate protein-calorie malnutrition: Secondary | ICD-10-CM | POA: Diagnosis not present

## 2021-10-01 DIAGNOSIS — L989 Disorder of the skin and subcutaneous tissue, unspecified: Secondary | ICD-10-CM | POA: Diagnosis not present

## 2021-10-01 DIAGNOSIS — G309 Alzheimer's disease, unspecified: Secondary | ICD-10-CM | POA: Diagnosis not present

## 2021-11-19 DIAGNOSIS — L0291 Cutaneous abscess, unspecified: Secondary | ICD-10-CM | POA: Diagnosis not present

## 2021-11-21 DIAGNOSIS — Z85828 Personal history of other malignant neoplasm of skin: Secondary | ICD-10-CM | POA: Diagnosis not present

## 2021-11-21 DIAGNOSIS — L720 Epidermal cyst: Secondary | ICD-10-CM | POA: Diagnosis not present

## 2021-11-21 DIAGNOSIS — L02212 Cutaneous abscess of back [any part, except buttock]: Secondary | ICD-10-CM | POA: Diagnosis not present

## 2021-11-21 DIAGNOSIS — L0889 Other specified local infections of the skin and subcutaneous tissue: Secondary | ICD-10-CM | POA: Diagnosis not present

## 2022-01-01 DIAGNOSIS — Z85828 Personal history of other malignant neoplasm of skin: Secondary | ICD-10-CM | POA: Diagnosis not present

## 2022-01-01 DIAGNOSIS — L720 Epidermal cyst: Secondary | ICD-10-CM | POA: Diagnosis not present

## 2022-01-15 DIAGNOSIS — Z23 Encounter for immunization: Secondary | ICD-10-CM | POA: Diagnosis not present

## 2022-07-23 DIAGNOSIS — G309 Alzheimer's disease, unspecified: Secondary | ICD-10-CM | POA: Diagnosis not present

## 2022-07-23 DIAGNOSIS — R011 Cardiac murmur, unspecified: Secondary | ICD-10-CM | POA: Diagnosis not present

## 2022-07-23 DIAGNOSIS — E785 Hyperlipidemia, unspecified: Secondary | ICD-10-CM | POA: Diagnosis not present

## 2022-07-23 DIAGNOSIS — I10 Essential (primary) hypertension: Secondary | ICD-10-CM | POA: Diagnosis not present

## 2022-07-23 DIAGNOSIS — G311 Senile degeneration of brain, not elsewhere classified: Secondary | ICD-10-CM | POA: Diagnosis not present

## 2022-07-23 DIAGNOSIS — Z8546 Personal history of malignant neoplasm of prostate: Secondary | ICD-10-CM | POA: Diagnosis not present

## 2022-07-23 DIAGNOSIS — R1310 Dysphagia, unspecified: Secondary | ICD-10-CM | POA: Diagnosis not present

## 2022-07-23 DIAGNOSIS — E46 Unspecified protein-calorie malnutrition: Secondary | ICD-10-CM | POA: Diagnosis not present

## 2022-07-23 DIAGNOSIS — N3946 Mixed incontinence: Secondary | ICD-10-CM | POA: Diagnosis not present

## 2022-07-23 DIAGNOSIS — M6281 Muscle weakness (generalized): Secondary | ICD-10-CM | POA: Diagnosis not present

## 2022-07-24 DIAGNOSIS — E46 Unspecified protein-calorie malnutrition: Secondary | ICD-10-CM | POA: Diagnosis not present

## 2022-07-24 DIAGNOSIS — E785 Hyperlipidemia, unspecified: Secondary | ICD-10-CM | POA: Diagnosis not present

## 2022-07-24 DIAGNOSIS — I10 Essential (primary) hypertension: Secondary | ICD-10-CM | POA: Diagnosis not present

## 2022-07-24 DIAGNOSIS — G309 Alzheimer's disease, unspecified: Secondary | ICD-10-CM | POA: Diagnosis not present

## 2022-07-24 DIAGNOSIS — Z8546 Personal history of malignant neoplasm of prostate: Secondary | ICD-10-CM | POA: Diagnosis not present

## 2022-07-24 DIAGNOSIS — G311 Senile degeneration of brain, not elsewhere classified: Secondary | ICD-10-CM | POA: Diagnosis not present

## 2022-07-29 DIAGNOSIS — E46 Unspecified protein-calorie malnutrition: Secondary | ICD-10-CM | POA: Diagnosis not present

## 2022-07-29 DIAGNOSIS — G309 Alzheimer's disease, unspecified: Secondary | ICD-10-CM | POA: Diagnosis not present

## 2022-07-29 DIAGNOSIS — G311 Senile degeneration of brain, not elsewhere classified: Secondary | ICD-10-CM | POA: Diagnosis not present

## 2022-07-29 DIAGNOSIS — E785 Hyperlipidemia, unspecified: Secondary | ICD-10-CM | POA: Diagnosis not present

## 2022-07-29 DIAGNOSIS — Z8546 Personal history of malignant neoplasm of prostate: Secondary | ICD-10-CM | POA: Diagnosis not present

## 2022-07-29 DIAGNOSIS — I10 Essential (primary) hypertension: Secondary | ICD-10-CM | POA: Diagnosis not present

## 2022-08-20 DEATH — deceased
